# Patient Record
Sex: Female | Born: 1960
Health system: Southern US, Community
[De-identification: ages and names within clinical notes are randomized; demographics above are authoritative.]

## PROBLEM LIST (undated history)

## (undated) DIAGNOSIS — E785 Hyperlipidemia, unspecified: Secondary | ICD-10-CM

## (undated) DIAGNOSIS — E119 Type 2 diabetes mellitus without complications: Secondary | ICD-10-CM

## (undated) DIAGNOSIS — F17210 Nicotine dependence, cigarettes, uncomplicated: Secondary | ICD-10-CM

## (undated) DIAGNOSIS — F32A Depression, unspecified: Secondary | ICD-10-CM

## (undated) HISTORY — DX: Hyperlipidemia, unspecified: E78.5

## (undated) HISTORY — DX: Nicotine dependence, cigarettes, uncomplicated: F17.210

## (undated) HISTORY — PX: NO PAST SURGERIES: SHX2092

## (undated) HISTORY — DX: Depression, unspecified: F32.A

---

## 1998-02-16 ENCOUNTER — Other Ambulatory Visit: Admission: RE | Admit: 1998-02-16 | Discharge: 1998-02-16 | Payer: Self-pay | Admitting: *Deleted

## 1998-03-16 ENCOUNTER — Ambulatory Visit (HOSPITAL_COMMUNITY): Admission: RE | Admit: 1998-03-16 | Discharge: 1998-03-16 | Payer: Self-pay | Admitting: *Deleted

## 1998-03-31 ENCOUNTER — Ambulatory Visit (HOSPITAL_COMMUNITY): Admission: RE | Admit: 1998-03-31 | Discharge: 1998-03-31 | Payer: Self-pay | Admitting: Obstetrics

## 1998-05-31 ENCOUNTER — Ambulatory Visit (HOSPITAL_COMMUNITY): Admission: RE | Admit: 1998-05-31 | Discharge: 1998-05-31 | Payer: Self-pay | Admitting: *Deleted

## 1998-06-24 ENCOUNTER — Inpatient Hospital Stay (HOSPITAL_COMMUNITY): Admission: AD | Admit: 1998-06-24 | Discharge: 1998-06-24 | Payer: Self-pay | Admitting: *Deleted

## 1998-08-16 ENCOUNTER — Inpatient Hospital Stay (HOSPITAL_COMMUNITY): Admission: AD | Admit: 1998-08-16 | Discharge: 1998-08-18 | Payer: Self-pay | Admitting: *Deleted

## 1998-10-27 ENCOUNTER — Inpatient Hospital Stay (HOSPITAL_COMMUNITY): Admission: AD | Admit: 1998-10-27 | Discharge: 1998-10-27 | Payer: Self-pay | Admitting: *Deleted

## 1999-09-08 ENCOUNTER — Inpatient Hospital Stay (HOSPITAL_COMMUNITY): Admission: AD | Admit: 1999-09-08 | Discharge: 1999-09-08 | Payer: Self-pay | Admitting: *Deleted

## 1999-11-24 ENCOUNTER — Ambulatory Visit (HOSPITAL_COMMUNITY): Admission: RE | Admit: 1999-11-24 | Discharge: 1999-11-24 | Payer: Self-pay | Admitting: *Deleted

## 1999-11-24 ENCOUNTER — Encounter: Payer: Self-pay | Admitting: *Deleted

## 2000-01-02 ENCOUNTER — Inpatient Hospital Stay (HOSPITAL_COMMUNITY): Admission: AD | Admit: 2000-01-02 | Discharge: 2000-01-04 | Payer: Self-pay | Admitting: *Deleted

## 2000-01-02 ENCOUNTER — Encounter (INDEPENDENT_AMBULATORY_CARE_PROVIDER_SITE_OTHER): Payer: Self-pay

## 2001-04-25 ENCOUNTER — Emergency Department (HOSPITAL_COMMUNITY): Admission: EM | Admit: 2001-04-25 | Discharge: 2001-04-26 | Payer: Self-pay

## 2002-02-24 ENCOUNTER — Emergency Department (HOSPITAL_COMMUNITY): Admission: EM | Admit: 2002-02-24 | Discharge: 2002-02-24 | Payer: Self-pay | Admitting: *Deleted

## 2002-03-17 ENCOUNTER — Ambulatory Visit (HOSPITAL_COMMUNITY): Admission: RE | Admit: 2002-03-17 | Discharge: 2002-03-17 | Payer: Self-pay | Admitting: Family Medicine

## 2003-04-13 ENCOUNTER — Emergency Department (HOSPITAL_COMMUNITY): Admission: EM | Admit: 2003-04-13 | Discharge: 2003-04-13 | Payer: Self-pay | Admitting: *Deleted

## 2003-05-28 ENCOUNTER — Ambulatory Visit (HOSPITAL_COMMUNITY): Admission: RE | Admit: 2003-05-28 | Discharge: 2003-05-28 | Payer: Self-pay | Admitting: Family Medicine

## 2004-04-05 ENCOUNTER — Encounter: Admission: RE | Admit: 2004-04-05 | Discharge: 2004-04-05 | Payer: Self-pay | Admitting: Internal Medicine

## 2004-09-05 ENCOUNTER — Emergency Department (HOSPITAL_COMMUNITY): Admission: EM | Admit: 2004-09-05 | Discharge: 2004-09-05 | Payer: Self-pay | Admitting: Family Medicine

## 2004-09-20 ENCOUNTER — Ambulatory Visit: Payer: Self-pay | Admitting: Sports Medicine

## 2004-09-20 ENCOUNTER — Encounter (INDEPENDENT_AMBULATORY_CARE_PROVIDER_SITE_OTHER): Payer: Self-pay | Admitting: *Deleted

## 2004-10-26 ENCOUNTER — Ambulatory Visit: Payer: Self-pay | Admitting: Family Medicine

## 2004-11-02 ENCOUNTER — Ambulatory Visit: Payer: Self-pay | Admitting: Family Medicine

## 2005-12-06 ENCOUNTER — Encounter (INDEPENDENT_AMBULATORY_CARE_PROVIDER_SITE_OTHER): Payer: Self-pay | Admitting: *Deleted

## 2005-12-27 ENCOUNTER — Ambulatory Visit: Payer: Self-pay | Admitting: Family Medicine

## 2006-01-07 ENCOUNTER — Encounter: Admission: RE | Admit: 2006-01-07 | Discharge: 2006-01-07 | Payer: Self-pay | Admitting: Sports Medicine

## 2006-04-21 ENCOUNTER — Emergency Department (HOSPITAL_COMMUNITY): Admission: EM | Admit: 2006-04-21 | Discharge: 2006-04-21 | Payer: Self-pay | Admitting: Emergency Medicine

## 2006-05-21 ENCOUNTER — Ambulatory Visit: Payer: Self-pay | Admitting: Family Medicine

## 2006-05-31 ENCOUNTER — Ambulatory Visit (HOSPITAL_COMMUNITY): Admission: RE | Admit: 2006-05-31 | Discharge: 2006-05-31 | Payer: Self-pay | Admitting: Sports Medicine

## 2006-05-31 ENCOUNTER — Ambulatory Visit: Payer: Self-pay | Admitting: Sports Medicine

## 2006-06-04 ENCOUNTER — Ambulatory Visit: Payer: Self-pay | Admitting: Family Medicine

## 2006-06-20 ENCOUNTER — Ambulatory Visit: Payer: Self-pay | Admitting: Family Medicine

## 2006-07-23 ENCOUNTER — Ambulatory Visit: Payer: Self-pay | Admitting: Sports Medicine

## 2006-09-05 ENCOUNTER — Ambulatory Visit: Payer: Self-pay | Admitting: Family Medicine

## 2006-10-25 ENCOUNTER — Ambulatory Visit: Payer: Self-pay | Admitting: Family Medicine

## 2006-12-23 ENCOUNTER — Encounter: Admission: RE | Admit: 2006-12-23 | Discharge: 2006-12-23 | Payer: Self-pay | Admitting: Sports Medicine

## 2007-01-02 DIAGNOSIS — F17200 Nicotine dependence, unspecified, uncomplicated: Secondary | ICD-10-CM | POA: Insufficient documentation

## 2007-01-03 ENCOUNTER — Encounter (INDEPENDENT_AMBULATORY_CARE_PROVIDER_SITE_OTHER): Payer: Self-pay | Admitting: *Deleted

## 2007-02-19 ENCOUNTER — Ambulatory Visit: Payer: Self-pay | Admitting: Family Medicine

## 2007-02-19 ENCOUNTER — Encounter (INDEPENDENT_AMBULATORY_CARE_PROVIDER_SITE_OTHER): Payer: Self-pay | Admitting: Family Medicine

## 2007-02-19 LAB — CONVERTED CEMR LAB
ALT: 13 units/L (ref 0–35)
CO2: 22 meq/L (ref 19–32)
Glucose, Bld: 84 mg/dL (ref 70–99)
HDL: 51 mg/dL (ref >39)
LDL Cholesterol: 66 mg/dL (ref 0–99)
Potassium: 3.9 meq/L (ref 3.5–5.3)
Total Bilirubin: 0.9 mg/dL (ref 0.3–1.2)
Total Protein: 6.7 g/dL (ref 6.0–8.3)
VLDL: 12 mg/dL (ref 0–40)

## 2007-02-20 ENCOUNTER — Encounter (INDEPENDENT_AMBULATORY_CARE_PROVIDER_SITE_OTHER): Payer: Self-pay | Admitting: Family Medicine

## 2007-02-25 ENCOUNTER — Encounter: Payer: Self-pay | Admitting: *Deleted

## 2007-02-25 ENCOUNTER — Telehealth (INDEPENDENT_AMBULATORY_CARE_PROVIDER_SITE_OTHER): Payer: Self-pay | Admitting: Family Medicine

## 2007-02-26 ENCOUNTER — Encounter (INDEPENDENT_AMBULATORY_CARE_PROVIDER_SITE_OTHER): Payer: Self-pay | Admitting: Family Medicine

## 2007-03-21 ENCOUNTER — Ambulatory Visit: Payer: Self-pay | Admitting: Family Medicine

## 2007-03-21 ENCOUNTER — Encounter: Payer: Self-pay | Admitting: *Deleted

## 2007-03-21 LAB — CONVERTED CEMR LAB: Whiff Test: NEGATIVE

## 2007-03-26 ENCOUNTER — Ambulatory Visit (HOSPITAL_COMMUNITY): Admission: RE | Admit: 2007-03-26 | Discharge: 2007-03-26 | Payer: Self-pay | Admitting: Family Medicine

## 2007-03-26 ENCOUNTER — Ambulatory Visit: Payer: Self-pay | Admitting: Family Medicine

## 2007-03-26 ENCOUNTER — Telehealth: Payer: Self-pay | Admitting: *Deleted

## 2007-03-27 ENCOUNTER — Telehealth (INDEPENDENT_AMBULATORY_CARE_PROVIDER_SITE_OTHER): Payer: Self-pay | Admitting: *Deleted

## 2007-04-03 ENCOUNTER — Telehealth: Payer: Self-pay | Admitting: Family Medicine

## 2007-04-07 ENCOUNTER — Ambulatory Visit: Payer: Self-pay | Admitting: Cardiology

## 2007-05-12 ENCOUNTER — Encounter: Payer: Self-pay | Admitting: Cardiology

## 2007-05-12 ENCOUNTER — Ambulatory Visit: Payer: Self-pay

## 2007-05-12 ENCOUNTER — Ambulatory Visit: Payer: Self-pay | Admitting: Cardiovascular Disease

## 2007-07-18 ENCOUNTER — Ambulatory Visit: Payer: Self-pay | Admitting: Cardiology

## 2008-01-08 ENCOUNTER — Encounter: Admission: RE | Admit: 2008-01-08 | Discharge: 2008-01-08 | Payer: Self-pay | Admitting: Family Medicine

## 2008-01-16 ENCOUNTER — Encounter: Admission: RE | Admit: 2008-01-16 | Discharge: 2008-01-16 | Payer: Self-pay | Admitting: Family Medicine

## 2008-04-07 ENCOUNTER — Ambulatory Visit: Payer: Self-pay | Admitting: Family Medicine

## 2008-04-07 ENCOUNTER — Encounter: Payer: Self-pay | Admitting: Family Medicine

## 2008-04-07 DIAGNOSIS — E663 Overweight: Secondary | ICD-10-CM

## 2008-04-07 LAB — CONVERTED CEMR LAB
Glucose, Urine, Semiquant: NEGATIVE
Ketones, urine, test strip: NEGATIVE
Nitrite: NEGATIVE
Protein, U semiquant: NEGATIVE
Whiff Test: NEGATIVE

## 2008-04-13 ENCOUNTER — Encounter: Payer: Self-pay | Admitting: Family Medicine

## 2009-02-04 ENCOUNTER — Encounter: Admission: RE | Admit: 2009-02-04 | Discharge: 2009-02-04 | Payer: Self-pay | Admitting: Family Medicine

## 2009-04-11 ENCOUNTER — Encounter: Payer: Self-pay | Admitting: Family Medicine

## 2009-04-11 ENCOUNTER — Ambulatory Visit: Payer: Self-pay | Admitting: Family Medicine

## 2009-04-11 LAB — CONVERTED CEMR LAB
GC Probe Amp, Urine: NEGATIVE
Whiff Test: NEGATIVE

## 2009-04-13 ENCOUNTER — Encounter: Payer: Self-pay | Admitting: Family Medicine

## 2010-02-15 ENCOUNTER — Ambulatory Visit: Payer: Self-pay | Admitting: Family Medicine

## 2010-02-17 ENCOUNTER — Ambulatory Visit: Payer: Self-pay | Admitting: Family Medicine

## 2010-04-17 ENCOUNTER — Encounter: Payer: Self-pay | Admitting: Family Medicine

## 2010-05-23 ENCOUNTER — Ambulatory Visit: Payer: Self-pay | Admitting: Psychology

## 2010-05-30 ENCOUNTER — Ambulatory Visit: Payer: Self-pay | Admitting: Family Medicine

## 2010-07-05 ENCOUNTER — Ambulatory Visit: Payer: Self-pay | Admitting: Family Medicine

## 2010-07-06 ENCOUNTER — Encounter: Admission: RE | Admit: 2010-07-06 | Discharge: 2010-07-06 | Payer: Self-pay | Admitting: Family Medicine

## 2010-07-12 ENCOUNTER — Ambulatory Visit: Payer: Self-pay | Admitting: Family Medicine

## 2010-07-12 ENCOUNTER — Encounter: Payer: Self-pay | Admitting: Family Medicine

## 2010-07-13 ENCOUNTER — Telehealth: Payer: Self-pay | Admitting: *Deleted

## 2010-07-13 LAB — CONVERTED CEMR LAB
HDL: 43 mg/dL (ref >39)
Total CHOL/HDL Ratio: 3

## 2010-07-27 ENCOUNTER — Encounter: Payer: Self-pay | Admitting: Family Medicine

## 2010-07-27 ENCOUNTER — Ambulatory Visit: Payer: Self-pay | Admitting: Family Medicine

## 2010-09-12 ENCOUNTER — Ambulatory Visit: Payer: Self-pay | Admitting: Family Medicine

## 2010-09-12 DIAGNOSIS — Z780 Asymptomatic menopausal state: Secondary | ICD-10-CM

## 2010-09-12 LAB — CONVERTED CEMR LAB: Beta hcg, urine, semiquantitative: NEGATIVE

## 2010-10-13 ENCOUNTER — Ambulatory Visit: Payer: Self-pay

## 2010-10-19 ENCOUNTER — Emergency Department (HOSPITAL_COMMUNITY)
Admission: EM | Admit: 2010-10-19 | Discharge: 2010-10-20 | Payer: Self-pay | Source: Home / Self Care | Admitting: Emergency Medicine

## 2010-11-03 ENCOUNTER — Ambulatory Visit: Payer: Self-pay | Admitting: Family Medicine

## 2010-11-03 DIAGNOSIS — R209 Unspecified disturbances of skin sensation: Secondary | ICD-10-CM | POA: Insufficient documentation

## 2010-12-05 NOTE — Miscellaneous (Signed)
   Clinical Lists Changes  Problems: Removed problem of SCREENING EXAMINATION FOR PULMONARY TUBERCULOSIS (ICD-V74.1) Removed problem of ROUTINE GYNECOLOGICAL EXAMINATION (ICD-V72.31) Removed problem of SCREENING FOR MALIGNANT NEOPLASM, CERVIX (ICD-V76.2)

## 2010-12-05 NOTE — Assessment & Plan Note (Signed)
Vital Signs:  Patient profile:   50 year old female Height:      64 inches Weight:      167 pounds BMI:     28.77 Temp:     98.1 degrees F oral BP sitting:   108 / 69  (left arm) Cuff size:   regular  Vitals Entered By: Tessie Fass CMA (July 27, 2010 1:33 PM) CC: right eye pain x 3 days  Vision Screening:Left eye w/o correction: 20 / 20 Right Eye w/o correction: 20 / 20 Both eyes w/o correction:  20/ 20        Vision Entered By: Tessie Fass CMA (July 27, 2010 1:49 PM)   Primary Care Provider:  [facility]  CC:  right eye pain x 3 days.  History of Present Illness: Patient with feeling of foreign body in Right eye which began on Monday.  Attempted to remove foreign body by rubbing it, washing it out with water.  Happened while in her car with windows up, does not know what foreign body was or what happened.  Sensation resolved, but since then has been having increasing pain whenever she blinks or closes her eyes.  Describes pain as sharp, burning pain when she blinks.  Continual runny eyes.  ROS:  no fevers or chills, no change in vision, no scotomas or sudden loss of vision  Current Problems (verified): 1)  Corneal Abrasion, Right  (ICD-918.1) 2)  Lump or Mass in Breast  (ICD-611.72) 3)  Health Maintenance Exam  (ICD-V70.0) 4)  Screening For Malignant Neoplasm of The Cervix  (ICD-V76.2) 5)  Tobacco Dependence  (ICD-305.1) 6)  Bartholin's Cyst, Left or Right  (ICD-616.2) 7)  Overweight  (ICD-278.02)  Current Medications (verified): 1)  Wellbutrin Sr 150 Mg Xr12h-Tab (Bupropion Hcl) .... Take One Daily in The Am For Three Days. Then Increase To Twice Daily With Second Dose At3 Pm. 2)  Diclofenac Sodium 0.1 % Soln (Diclofenac Sodium) .... Instill 1 Drop Into Affected Eye 4 Times A Day 3)  Romycin 5 Mg/gm Oint (Erythromycin) .... Apply Ointment To Eye 3 Times A Day For Next 5 Days  Allergies (verified): No Known Drug Allergies  Past  History:  Past medical, surgical, family and social histories (including risk factors) reviewed, and no changes noted (except as noted below).  Past Medical History: Reviewed history from 02/19/2007 and no changes required. G2P2, vaginal deliveries no h/o abn paps trichomonas (12/2005) stress cardiolyte (for chest pain) negative 2007  Past Surgical History: Reviewed history from 02/19/2007 and no changes required. BTL - 11/06/1999 sebaceous cyst removed from right occipital region - 10/26/2004  Family History: Reviewed history from 02/19/2007 and no changes required. children - healthy father - DMII, HTN, alcoholic, deceased at 45y/o mother - DMII, HTN, kidney stones, living at 50y/o  Social History: Reviewed history from 04/11/2009 and no changes required. 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 ; No insurance; daughter Linford Arnold) and son Belva Crome). Pt smoked about 30 yrs; stopped smoking 7/312007.  Physical Exam  General:  Vital signs reviewed. Well-developed, well-nourished patient in NAD.  Awake and cooperative  Eyes:  Vision 20/20 both eyes Left eye WNL, no findings on exam. RIght eye with constant tearing, conjunctival erythema.  Woods lamp exam performed, demonstrated corneal abrasion lateral aspect of cornea after application of Fluroscein   Impression & Recommendations:  Problem # 1:  CORNEAL ABRASION, RIGHT (ICD-918.1) Applied Tetracaine in clinic for relief.  Prescribed Diclofenac drops and  Erythromycin ointment to be applied for antibiotic prophylaxis.  Patient is to return or call our clinic on Monday, if no relief over the weekend will urgently refer to ophthamologist.  Otherwise abrasion should heal well.   Orders: [facility]- Est Level  3 ([address]109)  Complete Medication List: 1)  Wellbutrin Sr 150 Mg Xr12h-tab (Bupropion hcl) .... Take one daily in the am for three days. then increase to twice daily with second dose at3 pm. 2)  Diclofenac  Sodium 0.1 % Soln (Diclofenac sodium) .... Instill 1 drop into affected eye 4 times a day 3)  Romycin 5 Mg/gm Oint (Erythromycin) .... Apply ointment to eye 3 times a day for next 5 days  Patient Instructions: 1)  You have a corneal abrasion, or a scratch on your eyeball.  2)  It should heal on its own. 3)  Apply the pain reliever (Diclofenac) 1 drop a day 4 times a day for relief. 4)  Apply the ointment 3 times a day. 5)  If you are not doing better by Monday, call and we will schedule you for an eye doctor.  Prescriptions: ROMYCIN 5 MG/GM OINT (ERYTHROMYCIN) Apply ointment to eye 3 times a day for next 5 days  #1 x 0   Entered and Authorized by:   Renold Don MD   Signed by:   Renold Don MD on 07/27/2010   Method used:   Print then Give to Patient   RxID:   [id number] DICLOFENAC SODIUM 0.1 % SOLN (DICLOFENAC SODIUM) Instill 1 drop into affected eye 4 times a day  #1 x 0   Entered and Authorized by:   Renold Don MD   Signed by:   Renold Don MD on 07/27/2010   Method used:   Print then Give to Patient   RxID:   [id number]

## 2010-12-05 NOTE — Assessment & Plan Note (Signed)
Summary: cpp, breast lump   Vital Signs:  Patient profile:   50 year old female Height:      64 inches Weight:      163.8 pounds BMI:     28.22 Temp:     98.8 degrees F Pulse rate:   76 / minute BP sitting:   110 / 71  Vitals Entered By: Golden Circle RN (July 05, 2010 8:28 AM)  Primary Care Khalis Hittle:  [facility]  CC:  Physical and breast exam.  History of Present Illness: 50 y/o F who comes in today for a physical. She has not had any recent concerns. She was suppose to do her mammogram months ago but has not yet done it. She will be due to start having a colonoscopy next year.   Tobacco: Pt still smokes about 2 cigs/day. She was able to quit last time for 3 years using the Welbutrin. She is taking it again and has been coming to the classes with Dr. Pascal Lux and Raymondo Band.   Habits & Providers  Alcohol-Tobacco-Diet     Alcohol drinks/day: 0     Tobacco Status: current     Tobacco Counseling: to quit use of tobacco products     Cigarette Packs/Day: <0.25  Exercise-Depression-Behavior     Does Patient Exercise: no     Have you felt down or hopeless? no     Have you felt little pleasure in things? no     Depression Counseling: not indicated; screening negative for depression     STD Risk: never     Drug Use: never     Seat Belt Use: always  Current Medications (verified): 1)  Wellbutrin Sr 150 Mg Xr12h-Tab (Bupropion Hcl) .... Take One Daily in The Am For Three Days. Then Increase To Twice Daily With Second Dose At3 Pm.  Allergies (verified): No Known Drug Allergies  Social History: Packs/Day:  <0.25 STD Risk:  never  Review of Systems       ROS neg  Physical Exam  General:  Well-developed,well-nourished,in no acute distress; alert,appropriate and cooperative throughout examination, VS reviewed, Overweight.  Head:  Normocephalic and atraumatic without obvious abnormalities. No apparent alopecia or balding. Eyes:  No corneal or conjunctival inflammation  noted. EOMI. Perrla. Funduscopic exam benign, without hemorrhages, exudates or papilledema. Vision grossly normal. Nose:  External nasal examination shows no deformity or inflammation. Nasal mucosa are pink and moist without lesions or exudates. Mouth:  Oral mucosa and oropharynx without lesions or exudates.  Teeth in good repair. Neck:  No deformities, masses, or tenderness noted. Breasts:  Rt breast had 1.5 cm lump just to the right of the nipple, Left breast normal.  Lungs:  Normal respiratory effort, chest expands symmetrically. Lungs are clear to auscultation, no crackles or wheezes. Heart:  Normal rate and regular rhythm. S1 and S2 normal without gallop, murmur, click, rub or other extra sounds. Abdomen:  Bowel sounds positive,abdomen soft and non-tender without masses, organomegaly or hernias noted. Genitalia:  Normal introitus for age, no external lesions, no vaginal discharge, mucosa pink and moist, no vaginal or cervical lesions, no vaginal atrophy, no friaility or hemorrhage, normal uterus size and position, no adnexal masses or tenderness Msk:  No deformity or scoliosis noted of thoracic or lumbar spine.   Skin:  Intact without suspicious lesions or rashes Axillary Nodes:  No palpable lymphadenopathy Psych:  Cognition and judgment appear intact. Alert and cooperative with normal attention span and concentration. No apparent delusions, illusions, hallucinations  Impression & Recommendations:  Problem # 1:  HEALTH MAINTENANCE EXAM (ICD-V70.0) Assessment Unchanged Other than the breast lump pt has normal exam. She is working on smoking cessation. She is due for a colonosocpy starting next eyar.   Orders: [facility] - Est  40-64 yrs (16109)  Problem # 2:  LUMP OR MASS IN BREAST (ICD-611.72) Assessment: New Pt had a new breast lump on PE. Sent for Diagnostic mammogram   Orders: Mammogram (Diagnostic) (Mammo)  Problem # 3:  OVERWEIGHT (ICD-278.02) Assessment: Unchanged Pt is  overweight. Plan to get a FLP in the future. SHe is not fasting today.   Future Orders: Lipid-[facility] ([id number]) ... 06/20/2011  Complete Medication List: 1)  Wellbutrin Sr 150 Mg Xr12h-tab (Bupropion hcl) .... Take one daily in the am for three days. then increase to twice daily with second dose at3 pm.  Other Orders: Pap Smear-[facility] ([id number])  Patient Instructions: 1)  Keep up the good work with the smoking cessation. You are doing very well!  2)  I have ordered a cholesterol panal for you.  3)  Make a LAB appointment with the front desk and come in some morning after fasting for 8-12 hours the night before.  4)  Get your mammogram done as soon as possible.

## 2010-12-05 NOTE — Progress Notes (Signed)
Summary: msg   Phone Note Call from Patient Call back at [facility] Phone [phone]   Caller: Patient Summary of Call: pt returned call Initial call taken by: De Nurse,  July 13, 2010 3:55 PM  Follow-up for Phone Call        informed pt of nl labs Follow-up by: Loralee Pacas CMA,  July 14, 2010 11:59 AM

## 2010-12-05 NOTE — Assessment & Plan Note (Signed)
Summary: tb test,tcb  Nurse Visit   Allergies: No Known Drug Allergies  Immunizations Administered:  PPD Skin Test:    Vaccine Type: PPD    Site: right forearm    Mfr: Sanofi Pasteur    Dose: 0.1 ml    Route: ID    Given by: Patricia Smith RN    Exp. Date: 08/18/2011    Lot #: [id number]  Orders Added: 1)  TB Skin Test [86580] 2)  Admin 1st Vaccine [90471] 

## 2010-12-05 NOTE — Assessment & Plan Note (Signed)
Summary: Smoking Cessation Class   Primary Care Provider:  Lequita Asal  MD   History of Present Illness: Patient attended a smoking cessation class, reporting that a similar class helped her to quit smoking a few years ago.  Currently, patient reported smoking about a half of a pack of cigarettes per day.    Allergies: No Known Drug Allergies   Impression & Recommendations:  Problem # 1:  TOBACCO DEPENDENCE (ICD-305.1)  Patient appeared to be in the preparation stage of change regarding smoking cessation.  On a scale of 1-10, she reported her desire to quit as an 8.  She set a quit date (06/04/10) at the end of the class.  Her primary motivation to quit smoking was health concerns.  She notices that she has a more difficult time breathing when she smokes.  Patient reported that she stopped smoking for a period of 3 years and that the smoking cessation course, plus the use of Wellbutrin, helped her to quit.  She expressed interest in talking to the pharmacist or her PCP about again using Wellbutrin as an aid.  Patient's daughter serves as a support for her quitting smoking.  Patient said that she likely will not be able to attend the next smoking cessation class (meet with pharmacist); however, I suggested that she try to schedule an appointment with the pharmacist or her PCP on a different day to talk about the use of medications as smoking cessation aids.     Armida Sans  Behavioral Medicine Student  May 24, 2010 9:42 AM  Orders: Smoking Cessation Class [phone])

## 2010-12-05 NOTE — Assessment & Plan Note (Signed)
Summary: pregnancy test/bmc   Vital Signs:  Patient profile:   50 year old female LMP:     06/13/2010 Weight:      167 pounds Temp:     98.8 degrees F oral Pulse rate:   69 / minute Pulse rhythm:   regular BP sitting:   117 / 72  (left arm) Cuff size:   regular  Vitals Entered By: Loralee Pacas CMA (September 12, 2010 2:37 PM)  Allergies: No Known Drug Allergies   Complete Medication List: 1)  Wellbutrin Sr 150 Mg Xr12h-tab (Bupropion hcl) .... Take one daily in the am for three days. then increase to twice daily with second dose at3 pm. 2)  Diclofenac Sodium 0.1 % Soln (Diclofenac sodium) .... Instill 1 drop into affected eye 4 times a day 3)  Romycin 5 Mg/gm Oint (Erythromycin) .... Apply ointment to eye 3 times a day for next 5 days  Other Orders: U Preg-[facility] ([id number])   Orders Added: 1)  U Preg-[facility] [[id number]]    Laboratory Results   Urine Tests  Date/Time Received: September 12, 2010 2:50 PM  Date/Time Reported: September 12, 2010 3:03 PM     Urine HCG: negative Comments: ...............test performed by......Marland KitchenBonnie A. [place], MLS (ASCP)cm

## 2010-12-05 NOTE — Assessment & Plan Note (Signed)
Summary: read tb/eo   Nurse Visit   Allergies: No Known Drug Allergies  PPD Results    Date of reading: 02/17/2010    Results: < 5mm    Interpretation: negative  Orders Added: 1)  Est Level 1- [facility] [[address]109]

## 2010-12-05 NOTE — Assessment & Plan Note (Signed)
Summary: F/U VISIT/[facility]   Habits & Providers  Alcohol-Tobacco-Diet     Tobacco Status: current     Cigarette Packs/Day: .5     Year Started: 1980     Pack years: 15.50  Current Medications (verified): 1)  Wellbutrin Sr 150 Mg Xr12h-Tab (Bupropion Hcl) .... Take One Daily in The Am For Three Days. Then Increase To Twice Daily With Second Dose At3 Pm.  Allergies (verified): No Known Drug Allergies  Social History: Smoking Status:  current Packs/Day:  .5   Impression & Recommendations:  Problem # 1:  TOBACCO DEPENDENCE (ICD-305.1)  Patient attended group tobacco cessation class.  Qit date selected: 06/04/2010 Mild - Moderate Nicotine Abuse in a patient who is: good candidate for success b/c [id number] long term quit with use of wellbutrin.  Initiated bupropion. Patient denies seizure history.  Counseled on purpose, proper use, and potential adverse effects, including insomnia.   Written information provided:  F/U Rx Clinic Visit: in September.   Next visit to meet new physician in August.    Total time with patient in face-to-face counseling: 15   minutes.  Patient seen with:  Ivy de Thana Ramp [employer], DO  .   Orders: Smoking Cessation Class (U7253)  Complete Medication List: 1)  Wellbutrin Sr 150 Mg Xr12h-tab (Bupropion hcl) .... Take one daily in the am for three days. then increase to twice daily with second dose at3 pm.  Patient Instructions: 1)  Start Wellbutrin 150mg  once daily in the AM for the first three days then increase to twice daily ( 2-3 PM).  2)  Schedule visit with [place] team doctor in the next month.  3)  Next visit with pharmacist in Rx Clinic in September.  Prescriptions: WELLBUTRIN SR 150 MG XR12H-TAB (BUPROPION HCL) Take one daily in the AM for three days. Then increase to twice daily with second dose at3 PM.  #60 x 1   Entered by:   Christian Mate D   Authorized by:   Marland Kitchen FPCDEFAULTPROVIDER   Signed by:   Madelon Lips Pharm D on 05/30/2010   Method used:    Faxed to ...       [facility] (retail)       [address] [place], [place]  [address]       Ph: [phone]       Fax: [phone]   RxID:   [id number]   Tobacco Counseling   Currently uses tobacco.    Cigarettes      Year started smoking cigarettes:      1980     Number of packs per day smoked:      .5     Years smoked:            27     Packs per year:          182.50     Pack Years:            15.50  Readiness to Quit:     Very Ready Cessation Stage:     Action Target Quit Date:     06/04/2010  Quitting Barriers:      -  stress     -  enjoys smoking  Quitting Motivators:      -  family pressure     -  social pressure     -  healthier lifestyle  Previous Quit Attempts   Previously Tried to quit:  Yes Longest Successful Quit Period:   3 years  Quit Methods Tried:      -  Zyban  Reason for restarting:      -  stress  Smoking Cessation Plan   Readiness:     Very Ready Cessation Stage:   Action Target Quit Date:   06/04/2010 New Medications: WELLBUTRIN SR 150 MG XR12H-TAB (BUPROPION HCL) Take one daily in the AM for three days. Then increase to twice daily with second dose at3 PM.  Medications Added this Update:  WELLBUTRIN SR 150 MG XR12H-TAB (BUPROPION HCL) Take one daily in the AM for three days. Then increase to twice daily with second dose at3 PM.  Orders Added this Update:  Smoking Cessation Class [N8295]

## 2010-12-07 NOTE — Assessment & Plan Note (Signed)
Summary: lf arm tingling/numbness off and on/[facility]   Vital Signs:  Patient profile:   50 year old female Weight:      167 pounds Temp:     98.3 degrees F oral Pulse rate:   70 / minute Pulse rhythm:   regular BP sitting:   160 / 65  (left arm) Cuff size:   regular  Vitals Entered By: Loralee Pacas CMA (November 03, 2010 1:50 PM) CC: tingling and numbness in left arm x 1 1/2 months Is Patient Diabetic? No Pain Assessment Patient in pain? no        Primary Care Provider:  [facility]  CC:  tingling and numbness in left arm x 1 1/2 months.  History of Present Illness: Left arm numbness: Pt has had intermittant left arm numbness for the last 1.5 monhts. She has numbness that starts at the shoulder (not the neck) and goes down into her entire hand. She does not have any pain or weakness. It happens more at work or when she has her hand laying in her lap but not always. She says she has not had any recent injuries to neck or back or shoulder but does sleep on that side sometimes. The numbness has not kept her from doing anything she loves to do.  Recently sent to ED, was evaluated, no causation found.   Current Medications (verified): 1)  Naprosyn 250 Mg Tabs (Naproxen) .... Take 1 Tablet Twice A Day For Pain.  Allergies (verified): No Known Drug Allergies  Review of Systems       no left sided facial numbness, no neck pain, no blurry vision, no limb pain  Physical Exam  General:  Well-developed,well-nourished,in no acute distress; alert,appropriate and cooperative throughout examination Msk:  Inspection equal arms bilaterally Palpation: no abnormalities noted on palpation bilaterall ROM: Pt has full ROm bilaterally Strength:equal strength with internal and external rotation of shoulder, no deficits on biceps and triceps testing, handgrip is normal bilaterally. No deficits noted.  No pain on neck palpation or pressure rotation.  All reflexes symmetrical  Extremities:   no cyanosis or edema to uper extremities    Impression & Recommendations:  Problem # 1:  DISTURBANCE OF SKIN SENSATION (ICD-782.0) Assessment New pt is having some numbness in her left arm from the shoulder down for the last 1.5 months. She has been evaluated in the ED, and I evaluated her here as well. There is no apparent cause for her intermittant numbness at this time. Cautioned that if it becomes consistant or painful she should come back to be reevaluated. Discussed with Dr. Jennette Kettle in office   Orders: [facility]- Est Level  3 ([address]454)  Complete Medication List: 1)  Naprosyn 250 Mg Tabs (Naproxen) .... Take 1 tablet twice a day for pain.  Patient Instructions: 1)  I was very reassured after talking to one of the sports medicine doctors. The fact that you don't have pain and/or constant numbness is reassuring.  2)  If you develop constant pain, constant numbness or weakness then come back to be evaluated.  3)  Try not to lay on your left side because this may be irritating a nerve in your arm.  Prescriptions: NAPROSYN 250 MG TABS (NAPROXEN) take 1 tablet twice a day for pain.  #60 x 3   Entered and Authorized by:   Jamie Brookes MD   Signed by:   Jamie Brookes MD on 11/03/2010   Method used:   Electronically to  [facility] [address] [phone]* (retail)       [address]       [place], [place]  [address]       Ph: [phone]       Fax: [phone]   RxID:   [id number]    Orders Added: 1)  [facility]- Est Level  3 [24401]

## 2010-12-25 ENCOUNTER — Encounter: Payer: Self-pay | Admitting: *Deleted

## 2011-01-15 LAB — CBC
HCT: 32.6 % — ABNORMAL LOW (ref 36.0–46.0)
Hemoglobin: 11.6 g/dL — ABNORMAL LOW (ref 12.0–15.0)
MCH: 29.4 pg (ref 26.0–34.0)
MCV: 82.7 fL (ref 78.0–100.0)
Platelets: 184 10*3/uL (ref 150–400)
RDW: 12.7 % (ref 11.5–15.5)

## 2011-01-15 LAB — BASIC METABOLIC PANEL
CO2: 25 mEq/L (ref 19–32)
Chloride: 108 mEq/L (ref 96–112)
GFR calc Af Amer: >60 mL/min (ref >60)
GFR calc non Af Amer: >60 mL/min (ref >60)
Glucose, Bld: 103 mg/dL — ABNORMAL HIGH (ref 70–99)
Potassium: 3.4 mEq/L — ABNORMAL LOW (ref 3.5–5.1)

## 2011-01-15 LAB — DIFFERENTIAL
Basophils Relative: 0 % (ref 0–1)
Eosinophils Absolute: 0.1 10*3/uL (ref 0.0–0.7)
Eosinophils Relative: 1 % (ref 0–5)
Monocytes Absolute: 0.6 10*3/uL (ref 0.1–1.0)
Monocytes Relative: 7 % (ref 3–12)
Neutro Abs: 5.5 10*3/uL (ref 1.7–7.7)

## 2011-03-20 NOTE — Letter (Signed)
July 18, 2007    Paticia Stack, MD  [address]  [place], [place]  [address]   RE:  ABAGALE, BOULOS  MRN:  [id number]  /  DOB:  1960/12/12   Dear Dr. Yetta Barre:   Ms. Hargrove stopped by today for a followup office visit.  She was in  a hurry and had to leave, but I did get an opportunity to have a brief  visit with her.  As you know, she had previously presented with some  chest discomfort.  The exact etiology of this was unclear.  Fortunately,  she has quit smoking.  In July the patient underwent an exercise  tolerance test.  She exercised for 9 minutes, her heart rate reached  164, 94% of age predicted maximum.  The test was felt to be a negative  test for ischemia.  The patient subsequently underwent echocardiography  which demonstrated normal left ventricular systolic function and no  regional wall motion abnormalities, and there was trivial aortic  regurgitation.  She also had carotid Dopplers which were entirely within  normal limits.  She also had had a prior chest x-ray in June of 2007  which revealed mild bronchitic changes.   I made it clear to her that I think it is important that she exercise  regularly and continue not to smoke.  I have encouraged her to follow up  with you in the office.  If she should have further problems, please do  not hesitate to let me know.  I think she is significantly improved and  she indicated as much when I saw her.  Thanks for referring her.    Sincerely,      Arturo Morton. Riley Kill, MD, [facility]  Electronically Signed    TDS/MedQ  DD: 07/23/2007  DT: 07/23/2007  Job #: [id number]

## 2011-03-20 NOTE — Assessment & Plan Note (Signed)
[facility]                            CARDIOLOGY OFFICE NOTE   NAME:Danielle Mccann               MRN:          [id number]  DATE:04/07/2007                            DOB:          [birth year]    REFERRING PHYSICIAN:  Paticia Stack, MD   CHIEF COMPLAINT:  My heart rate is slow.   HISTORY OF PRESENT ILLNESS:  [name] is a pleasant 50 year old  female who works at [employer].  Recently, while walking at [facility], the patient developed some mild shortness of breath and  had to sit down.  She developed a second episode similar to this when  she was at [employer].  There was no definite radiation.  She could not  feel any real tightening of the chest.  Fortunately, this women has  stopped smoking, about 9 months ago.  She says it was hard to describe,  but that it was just somewhat of an annoying feeling in the center of  the chest.  In further questioning, there was no clear radiation or  other associated symptoms such as nausea or vomiting.  Importantly, the  patient says that she did have a stress test done about a year ago over  at [facility].  She quit smoking about 9 months ago and on  careful questioning, she says that this was done primarily because of  some chest tightness at that time as well.  Also of note, the patient  does note that she has had a history of a murmur in the past.  She  admitted to this after we had discovered a murmur on examination.   PAST MEDICAL HISTORY:  The patient has had no hospitalizations.  She has  had no outpatient surgeries.  She has not had hypertension or diabetes.   MEDICATIONS:  She takes no medications.   ALLERGIES:  There are no known drug allergies.   SOCIAL HISTORY:  The patient has a boyfriend.  She has 2 children.  She  was a smoker, but has not smoked in nearly 9 months.  She does work at  [employer] at [employer].   REVIEW OF SYSTEMS:  The patient does  admit to a murmur in the past.  She  had chest tightness about a year ago.  She has been told that she needs  to take iron in the past and she has continued to have menstrual  periods; importantly, she has had 3 in the last month.  She has had  perhaps some trace edema in the feet when she works.   The review of systems is otherwise negative.   FAMILY HISTORY:  The patient's father died at 98; she said alcohol was a  major contributor; he had diabetes.  Her mother is currently 86 and has  had a CVA.  She has 5 siblings, 1 of whom lives in [place] and the  remainder live in the [place] area.  She says she does not know of any  specific illnesses related to this.   PHYSICAL EXAMINATION:  She is an alert, oriented female in no acute  distress.  The height is 5 feet 4 inches.  The weight is 170 pounds.  The blood  pressure is 112/58 on the right, 108/56 on the left.  The pulse is 75.  The carotid upstrokes are brisk.  There is a moderate left carotid  bruit.  There are also bruits noted in the clavicular area, although  these are not all that loud.  The neck is supple.  The thyroid is not  particularly enlarged.  The lung fields are clear to auscultation and percussion.  The PMI is nondisplaced.  There is a soft systolic ejection murmur of  1/6 to 2/6 noted at the left sternal edge.  Respirations and Valsalva do  not appear to change this.  ABDOMEN:  Soft without obvious hepatosplenomegaly.  EXTREMITIES:  No edema.  Distal pulses are intact.  NEUROLOGIC:  Exam is nonfocal.   ELECTROCARDIOGRAM:  Demonstrates normal sinus rhythm and diffuse  nonspecific T-wave flattening.   The patient had a chest x-ray done a year or so ago and she was told  that she had some inflammation and maybe some early emphysema.  She  knows no other information, but apparently this was at the [facility].   The patient did have some lab work done about a month ago for a physical   examination.   IMPRESSION:  1. A brief episode of mild chest discomfort and shortness of breath      associated with activity recently.  2. Borderline abnormal EKG with nonspecific T-wave abnormality.  3. Left carotid bruit.  4. Systolic ejection murmur, nonspecific.   RECOMMENDATIONS:  1. We will recommend that the patient have a two-dimensional      echocardiogram.  2. Carotid Doppler study should be done.  3. We will try to find information regarding recent blood work and/or      chest x-ray.  4. An exercise tolerance test will be ordered to determine both      exercise prescription and to exclude ischemia.  5. I have encouraged her on the discontinuation of her smoking.   We will have the patient return in the next week or so to try to get  these studies accomplished.  We will also try to obtain her old records.   CONSULT LEVEL:  III    Arturo Morton. Riley Kill, MD, [facility]  Electronically Signed   TDS/MedQ  DD: 04/07/2007  DT: 04/08/2007  Job #: [id number]   cc:   Morley Kos, M.D.

## 2011-03-20 NOTE — Procedures (Signed)
[facility]                              EXERCISE TREADMILL   NAME:[name], [name]               MRN:          [id number]  DATE:05/12/2007                            DOB:          August 22, 1961    INDICATION:  Chest pain.   Danielle Mccann, a 50 year old African-American female, had a  resting heart rate of 80 b.p.m. and a resting blood pressure of 117/66.  She exercised for 9 minutes according to the Bruce protocol, achieving a  maximum work level of 10.4 metabolic equivalents.  Her maximal heart  rate was 164 b.p.m., which represents 94% of her age predicted maximum  heart rate.  Her blood pressure rose to 116/68.  The test was stopped  due to fatigue.   FINDINGS:  Baseline EKG is within normal limits with the exception of a  nonspecific T-wave abnormality.  At low level exercise there was  ventricular bigeminy.  After 1 minute of exercise this resolved.  There  was no chest pain, arrhythmia or significant ST change with exercise.   CONCLUSION:  Negative exercise treadmill stress test for ischemia.     Veverly Fells. Excell Seltzer, MD  Electronically Signed    MDC/MedQ  DD: 05/12/2007  DT: 05/12/2007  Job #: [id number]   cc:   Arturo Morton. Riley Kill, MD, [facility]  Paticia Stack, MD  Morley Kos, M.D.

## 2011-03-23 NOTE — Op Note (Signed)
[facility]  Patient:    Danielle Mccann, Danielle Mccann               MRN: [id number] Proc. Date: 01/03/00 Adm. Date:  [id number] Attending:  Deniece Ree                           Operative Report  PREOPERATIVE DIAGNOSIS:       Multiparity, desirous of permanent sterilization.  POSTOPERATIVE DIAGNOSIS:  OPERATION:                    Bilateral tubal ligation using modified Pomeroy procedure.  SURGEON:                      Deniece Ree, M.D.  ASSISTANT:  ANESTHESIA:                   General anesthesia.  ESTIMATED BLOOD LOSS:         Less than 25 cc.  COMPLICATIONS:                None.  CONDITION:                    The patient tolerated the procedure well and returned to the recovery room in satisfactory condition.  DESCRIPTION OF PROCEDURE:     The patient was taken to the operating room and prepped and draped in the usual fashion for a postpartum sterilization procedure. A subumbilical incision was made.  This was carried down to the fascia and then the peritoneum.  After the incision was extended and with some manipulation, the right tube was identified, grasped with a Babcock clamp, and followed out until the fimbriated end was identified.  At this point, it was knuckled up and utilizing 0 plain catgut ligated without any difficulty.  This was done likewise on the opposite side.  Both tubal segments were then labled and sent to pathology. Both tubal stump areas were then cauterized with the use of a cautery.  At this point, sponge, needle, and instrument counts were correct x 2 and hemostasis was present. The abdominal peritoneum and fascia were closed using 0 chromic in a running locking stitch followed by a subcuticular stitch using 4-0 Vicryl.  The procedure was terminated.  The patient tolerated the procedure well and returned to the recovery room in satisfactory condition. DD:  01/03/00 TD:  01/03/00 Job:  [id number]

## 2011-03-23 NOTE — H&P (Signed)
[facility]  Patient:    Danielle Mccann, Danielle Mccann               MRN: [id number] Adm. Date:  [id number] Attending:  Deniece Ree                         History and Physical  HISTORY OF PRESENT ILLNESS:   The patient is a 50 year old, gravida 2, para 2, status post vaginal delivery of a female infant.  The patient is desirous of permanent sterilization which she has stated throughout this pregnancy.  All of her questions were answered to her satisfaction.  She understands that this procedure is intended to be permanent, however, cannot be guaranteed.  She is scheduled at this time for a bilateral tubal ligation.  PHYSICAL EXAMINATION:  GENERAL:                      Revealed a well-developed, well-nourished, postpartum female in no acute distress.  HEENT:                        Within normal limits.  NECK:                         Supple.  BREASTS:                      Without masses, tenderness, or discharge.  LUNGS:                        Clear to auscultation and percussion.  HEART:                        Normal sinus rhythm without murmurs, rubs, or gallops.  ABDOMEN:                      Benign with the uterine fundus extending to the umbilicus.  EXTREMITIES: NEUROLOGICAL:    Within normal limits.  PELVIC:                       Not done.  ADMISSION DIAGNOSES:          1. Multiparity.                               2. Desirous of permanent sterilization.  PLAN:                         Bilateral tubal ligation using the modified Pomeroy procedure. DD:  01/03/00 TD:  01/03/00 Job: [id number]

## 2011-07-12 ENCOUNTER — Other Ambulatory Visit: Payer: Self-pay | Admitting: Family Medicine

## 2011-07-12 DIAGNOSIS — Z1231 Encounter for screening mammogram for malignant neoplasm of breast: Secondary | ICD-10-CM

## 2011-08-14 ENCOUNTER — Ambulatory Visit: Payer: Self-pay

## 2011-09-04 ENCOUNTER — Encounter: Payer: Self-pay | Admitting: Family Medicine

## 2011-10-24 ENCOUNTER — Ambulatory Visit: Payer: Self-pay

## 2011-11-14 ENCOUNTER — Emergency Department (HOSPITAL_COMMUNITY)
Admission: EM | Admit: 2011-11-14 | Discharge: 2011-11-14 | Disposition: A | Discharge status: Home / Self Care | Payer: Self-pay | Source: Home / Self Care | Attending: Emergency Medicine | Admitting: Emergency Medicine

## 2011-11-14 DIAGNOSIS — Z5321 Procedure and treatment not carried out due to patient leaving prior to being seen by health care provider: Secondary | ICD-10-CM

## 2011-11-14 NOTE — ED Provider Notes (Signed)
History     CSN: [id number]  Arrival date & time 11/14/11  1741   First MD Initiated Contact with Patient 11/14/11 1836      No chief complaint on file.   (Consider location/radiation/quality/duration/timing/severity/associated sxs/prior treatment) HPI  No past medical history on file.  No past surgical history on file.  No family history on file.  History  Substance Use Topics  . Smoking status: Not on file  . Smokeless tobacco: Not on file  . Alcohol Use: Not on file    OB History    No data available      Review of Systems  Allergies  Review of patient's allergies indicates not on file.  Home Medications   Current Outpatient Rx  Name Route Sig Dispense Refill  . NAPROXEN 250 MG PO TABS Oral Take 250 mg by mouth 2 (two) times daily.        There were no vitals taken for this visit.  Physical Exam  ED Course  Procedures (including critical care time)  Labs Reviewed - No data to display No results found.   No diagnosis found.    MDM  Pt left prior to triage. I did not participate in the care of this pt.  Luiz Blare, MD 11/14/11 2141

## 2011-11-16 ENCOUNTER — Encounter: Payer: Self-pay | Admitting: Family Medicine

## 2011-11-16 ENCOUNTER — Ambulatory Visit (INDEPENDENT_AMBULATORY_CARE_PROVIDER_SITE_OTHER): Payer: Self-pay | Admitting: Family Medicine

## 2011-11-16 VITALS — BP 106/69 | HR 66 | Temp 98.0°F | Ht 64.0 in | Wt 168.0 lb

## 2011-11-16 DIAGNOSIS — R21 Rash and other nonspecific skin eruption: Secondary | ICD-10-CM

## 2011-11-16 DIAGNOSIS — L989 Disorder of the skin and subcutaneous tissue, unspecified: Secondary | ICD-10-CM | POA: Insufficient documentation

## 2011-11-16 HISTORY — DX: Disorder of the skin and subcutaneous tissue, unspecified: L98.9

## 2011-11-16 MED ORDER — TRIAMCINOLONE ACETONIDE 0.1 % EX CREA: TOPICAL_CREAM | Freq: Two times a day (BID) | CUTANEOUS | Status: DC

## 2011-11-16 NOTE — Patient Instructions (Signed)
Could be due to new soaps, detergents, or bug bites you encountered while traveling.  Use triamcinolone cream to help with itching- don't scratch  If you start to get new spots or does not go away, come back for re-evaluation

## 2011-11-16 NOTE — Assessment & Plan Note (Signed)
Rash likely exposure to new irritants vs bug bites after traveling.  No new lesions and all seem to be resolving- no evidence of burrows or continual exposures..  Will treat symptomatically with topical steroid and advised to return if does not continue to resolve or has new lesions

## 2011-11-16 NOTE — Progress Notes (Signed)
  Subjective:    Patient ID: Danielle Mccann, female    DOB: 15-May-1961, 51 y.o.   MRN: [id number]  HPI Work in appt for rash  Started 5 days ago after she had a visit at her sister's house in [place].  Does not know if sister was affected but her children have no symptoms.  Many new exposure to soaps and detergents while at her house.  No fever,chills.  Bumps notes on her arms and legs and back.  Very pruritic.  No new lesions- all occurred at once.   Review of Systemssee HPI     Objective:   Physical Exam GEN: NAD Skin:  Appears as if started as small papules which she has scratched the top off.  Several lesions notes on both surfaces of forearms, a few on lower legs, some on back.  Non in web spaces, neck, umbilicus.       Assessment & Plan:

## 2012-03-19 ENCOUNTER — Emergency Department (HOSPITAL_COMMUNITY)
Admission: EM | Admit: 2012-03-19 | Discharge: 2012-03-19 | Disposition: A | Discharge status: Home / Self Care | Payer: No Typology Code available for payment source | Attending: Emergency Medicine | Admitting: Emergency Medicine

## 2012-03-19 ENCOUNTER — Encounter (HOSPITAL_COMMUNITY): Payer: Self-pay | Admitting: Emergency Medicine

## 2012-03-19 ENCOUNTER — Emergency Department (HOSPITAL_COMMUNITY): Payer: No Typology Code available for payment source

## 2012-03-19 DIAGNOSIS — R079 Chest pain, unspecified: Secondary | ICD-10-CM | POA: Insufficient documentation

## 2012-03-19 DIAGNOSIS — Y92410 Unspecified street and highway as the place of occurrence of the external cause: Secondary | ICD-10-CM | POA: Insufficient documentation
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
## 2012-03-24 ENCOUNTER — Ambulatory Visit (INDEPENDENT_AMBULATORY_CARE_PROVIDER_SITE_OTHER): Payer: No Typology Code available for payment source | Admitting: Family Medicine

## 2012-03-24 ENCOUNTER — Encounter: Payer: Self-pay | Admitting: Family Medicine

## 2012-03-24 ENCOUNTER — Telehealth: Payer: Self-pay | Admitting: Family Medicine

## 2012-03-24 VITALS — BP 113/73 | HR 61 | Temp 98.1°F | Ht 64.0 in | Wt 166.5 lb

## 2012-03-24 DIAGNOSIS — Z01419 Encounter for gynecological examination (general) (routine) without abnormal findings: Secondary | ICD-10-CM

## 2012-03-24 DIAGNOSIS — Z131 Encounter for screening for diabetes mellitus: Secondary | ICD-10-CM

## 2012-03-24 DIAGNOSIS — Z13220 Encounter for screening for lipoid disorders: Secondary | ICD-10-CM

## 2012-03-24 NOTE — Progress Notes (Signed)
  Subjective:    Patient ID: Candis Musa, female    DOB: 12/10/1960, 51 y.o.   MRN: [id number]  HPI Annual Gynecological Exam  G2P2 Wt Readings from Last 3 Encounters:  03/24/12 166 lb 8 oz (75.524 kg)  11/16/11 168 lb (76.204 kg)  11/03/10 167 lb (75.751 kg)   Last period:  over a year ago, menopausal Regular periods: No  Heavy bleeding: no  Sexually active: no Birth control or hormonal therapy:no Hx of STD: Patient does notdesires STD screening Dyspareunia: No Hot flashes: yes, manageable. Vaginal discharge: no Dysuria:No  Last mammogram: over 1 year ago, normal Breast mass or concerns: No Last Pap: 06/2010 History of abnormal pap: No  FH of breast, uterine, ovarian, colon cancer: No  I have reviewed patient's  PMH, FH, and Social history and Medications as related to this visit.      Review of Systems     Objective:   Physical Exam  GEN: Alert & Oriented, No acute distress Neck: no thyromegaly or LAD CV:  Regular Rate & Rhythm, no murmur Respiratory:  Normal work of breathing, CTAB Abd:  + BS, soft, no tenderness to palpation Ext: no pre-tibial edema Breast: normal Pelvic Exam:        External: normal female genitalia without lesions or masses        Vagina: normal without lesions or masses        Cervix: normal without lesions or masses        Adnexa: normal bimanual exam without masses or fullness        Uterus: normal by palpation      Assessment & Plan:  Annual physical:  Mammogram pending, given info on scheduling colonoscopy.  Counseled on overweight BMI and smoking cessation.  Will return for fasting bloodowkr.  Follow-up annual orearlier  as needed.

## 2012-03-24 NOTE — Patient Instructions (Signed)
Think about increasing exercise and weight bearing exercise to help keep you healthy!  Options for help with smoking: Dr. Pascal Lux for refresher on tips for quitting Dr. Raymondo Band to counsel and talk more about nicotine replacement therapies. [place] quit line, free, can offer nicotine replacement while supplies last:  1-800-QUIT-NOW  Return for fasting bloodwork to check diabetes and cholesterol (nothing but water for 8 hours)  Follow-up yearly or sooner if needed

## 2012-03-24 NOTE — Telephone Encounter (Signed)
Pt has the orange card and the doctor gave her numbers to call GI doctors but they don't take the orange card.  Wants to know if we can let her know who does.

## 2012-03-24 NOTE — Telephone Encounter (Signed)
Spoke with patient and informed her that [facility] is not accepting any referrals until June. Patient understands that we will not be able to send out until then.

## 2012-04-01 ENCOUNTER — Other Ambulatory Visit: Payer: No Typology Code available for payment source

## 2012-04-21 ENCOUNTER — Ambulatory Visit
Admission: RE | Admit: 2012-04-21 | Discharge: 2012-04-21 | Disposition: A | Discharge status: Home / Self Care | Payer: Self-pay | Source: Ambulatory Visit | Attending: Family Medicine | Admitting: Family Medicine

## 2012-04-21 DIAGNOSIS — Z1231 Encounter for screening mammogram for malignant neoplasm of breast: Secondary | ICD-10-CM

## 2012-04-22 ENCOUNTER — Other Ambulatory Visit: Payer: Self-pay

## 2012-04-22 ENCOUNTER — Ambulatory Visit: Payer: Self-pay

## 2012-04-22 DIAGNOSIS — Z13220 Encounter for screening for lipoid disorders: Secondary | ICD-10-CM

## 2012-04-22 DIAGNOSIS — Z131 Encounter for screening for diabetes mellitus: Secondary | ICD-10-CM

## 2012-04-22 LAB — COMPREHENSIVE METABOLIC PANEL
ALT: 18 U/L (ref 0–35)
CO2: 26 mEq/L (ref 19–32)
Creat: 0.7 mg/dL (ref 0.50–1.10)
Glucose, Bld: 81 mg/dL (ref 70–99)
Total Bilirubin: 0.9 mg/dL (ref 0.3–1.2)

## 2012-04-22 LAB — LIPID PANEL
Cholesterol: 144 mg/dL (ref 0–200)
Total CHOL/HDL Ratio: 3.3 Ratio
Triglycerides: 87 mg/dL (ref <150)
VLDL: 17 mg/dL (ref 0–40)

## 2012-04-22 NOTE — Progress Notes (Signed)
CMP AND FLP DONE TODAY Danielle Mccann 

## 2012-04-24 ENCOUNTER — Encounter: Payer: Self-pay | Admitting: Family Medicine

## 2012-05-15 ENCOUNTER — Telehealth: Payer: Self-pay | Admitting: Family Medicine

## 2012-05-15 NOTE — Telephone Encounter (Signed)
Patient is calling for a Referral for a Colonoscopy and needs to know of a place that takes the [facility] card.

## 2012-05-19 NOTE — Telephone Encounter (Signed)
Left message informing patient that there were no GI specialist available for the month of July through [facility] and we could try and process this referral again in August.

## 2012-06-12 NOTE — Telephone Encounter (Signed)
Spoke with patient and informed her that there are no GI doctor that accepts the orange card and that she will have to pay out of pocket. Will call [facility] and find out price for patient

## 2012-06-12 NOTE — Telephone Encounter (Addendum)
Pt is asking to see if she can be referred this month for her GI specialist.  Wants to know how we know who is available.

## 2012-10-30 ENCOUNTER — Ambulatory Visit (INDEPENDENT_AMBULATORY_CARE_PROVIDER_SITE_OTHER): Payer: Self-pay | Admitting: Family Medicine

## 2012-10-30 ENCOUNTER — Encounter: Payer: Self-pay | Admitting: Family Medicine

## 2012-10-30 ENCOUNTER — Other Ambulatory Visit (HOSPITAL_COMMUNITY)
Admission: RE | Admit: 2012-10-30 | Discharge: 2012-10-30 | Disposition: A | Discharge status: Home / Self Care | Payer: Self-pay | Source: Ambulatory Visit | Attending: Family Medicine | Admitting: Family Medicine

## 2012-10-30 VITALS — BP 122/75 | HR 74 | Temp 98.3°F | Ht 64.0 in | Wt 164.0 lb

## 2012-10-30 DIAGNOSIS — Z2089 Contact with and (suspected) exposure to other communicable diseases: Secondary | ICD-10-CM

## 2012-10-30 DIAGNOSIS — Z202 Contact with and (suspected) exposure to infections with a predominantly sexual mode of transmission: Secondary | ICD-10-CM

## 2012-10-30 DIAGNOSIS — Z7251 High risk heterosexual behavior: Secondary | ICD-10-CM

## 2012-10-30 DIAGNOSIS — Z113 Encounter for screening for infections with a predominantly sexual mode of transmission: Secondary | ICD-10-CM | POA: Insufficient documentation

## 2012-10-30 LAB — POCT WET PREP (WET MOUNT): Clue Cells Wet Prep Whiff POC: NEGATIVE

## 2012-10-30 NOTE — Progress Notes (Addendum)
  Subjective:    Patient ID: Danielle Mccann, female    DOB: [birth year], 51 y.o.   MRN: [id number]  HPI Here to evaluate STD.  Patient found out several weeks ago her boyfriend has had intercourse outside of their relationship.  She has heard rumors one of his partners may be HIV positive.  Denies change in health, vaginal discharge.   Review of Systems See HPI    Objective:   Physical Exam GEN: NAD Pelvic Exam:        External: normal female genitalia without lesions or masses        Vagina: normal without lesions or masses        Cervix: normal without lesions or masses        Adnexa: normal bimanual exam without masses or fullness        Uterus: normal by palpation        Samples for Wet prep, GC/Chlamydia obtained        Assessment & Plan:  Discussed with patient pos yeast.  Asymptomatic.  We decided not to treat

## 2012-10-30 NOTE — Assessment & Plan Note (Signed)
Will screen for HIV, RPR, GC/CHl/Trichomonas.  WIll also check Hep B and Hep C

## 2012-10-30 NOTE — Patient Instructions (Addendum)
Will check for gonorrhea, chlamydia, HIV, syphilis, hepatitis B and C  Will send you letter with normal results in 1-2 weeks  Will call you with positive results

## 2012-10-30 NOTE — Addendum Note (Signed)
Addended by: Macy Mis on: 10/30/2012 04:26 PM   Modules accepted: Orders

## 2012-11-10 ENCOUNTER — Encounter: Payer: Self-pay | Admitting: Family Medicine

## 2012-12-09 ENCOUNTER — Encounter: Payer: Self-pay | Admitting: Family Medicine

## 2012-12-09 ENCOUNTER — Ambulatory Visit (INDEPENDENT_AMBULATORY_CARE_PROVIDER_SITE_OTHER): Payer: Self-pay | Admitting: Family Medicine

## 2012-12-09 VITALS — BP 103/67 | HR 76 | Temp 98.4°F | Ht 64.0 in | Wt 165.0 lb

## 2012-12-09 DIAGNOSIS — E663 Overweight: Secondary | ICD-10-CM

## 2012-12-09 DIAGNOSIS — Z23 Encounter for immunization: Secondary | ICD-10-CM | POA: Insufficient documentation

## 2012-12-09 DIAGNOSIS — L089 Local infection of the skin and subcutaneous tissue, unspecified: Secondary | ICD-10-CM | POA: Insufficient documentation
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
## 2012-12-09 NOTE — Assessment & Plan Note (Signed)
Resolving.  Advised continued home care.

## 2012-12-09 NOTE — Progress Notes (Signed)
  Subjective:    Patient ID: Danielle Mccann, female    DOB: 1960-12-23, 52 y.o.   MRN: [id number]  HPI Finger infection:  Left index finger- was infected draining pus after tearing off a piece of skin.  Has been self treating with alcohol soaks.  Now much improved.  Wanted doctor to check on it to ensure healing.  No history of vesicle.   Concerned with poor appetite- inquires about need for appetite supplement  Works 3rd shift.  Eats dinner before going in, snacks throughout her shift but does not eat a email, then may pick up a breakfast biscuit on her way home.   NO symptoms of dysphagia or early satiety or abdominal pain.  Does not dislike eating or have symtpoms- sometimes forgets.   Not doing any exercise, people have noted on her weight loss.  Denies sadness, mood changes.  Wt Readings from Last 3 Encounters:  12/09/12 165 lb (74.844 kg)  10/30/12 164 lb (74.39 kg)  03/24/12 166 lb 8 oz (75.524 kg)   Hep B:  Immunity low, works in Engineer, site.  Does not recall every getting initial vaccination series.     Review of Systems See HPI    Objective:   Physical Exam GEN: Alert & Oriented, No acute distress CV:  Regular Rate & Rhythm, no murmur Respiratory:  Normal work of breathing, CTAB Abd:  + BS, soft, no tenderness to palpation Ext: no pre-tibial edema        Assessment & Plan:

## 2012-12-09 NOTE — Assessment & Plan Note (Signed)
Overweight with No weight loss, no red flags of early satiety or GI symptoms.  Advised although she does not sit down for many full meals, that she is likely getting adequate calories from snacking.  Advised to plan on bring nutritious meal/snack to eat while at work to schedule time to eat, and be cognizant of constant snacking at work - empty calories.  She plans on starting to go to gym with friends, I encouraged weight bearing exercise to maintain muscle mass

## 2012-12-09 NOTE — Patient Instructions (Addendum)
Hep B vaccination today, in 1 month, and 6 months from today  Will check labs to see how your body responded 1-2 months after you are finished with your shots  Try to eat every 4 hours when you are awake- we discussed some healthy snacks  If you feel you are not eating enough, keep a food log- sometimes snacks can add up  Try to add in exercise to keep your muscles strong  Due for physical at end of May

## 2013-01-22 ENCOUNTER — Ambulatory Visit (INDEPENDENT_AMBULATORY_CARE_PROVIDER_SITE_OTHER): Payer: Self-pay | Admitting: *Deleted

## 2013-01-22 DIAGNOSIS — Z289 Immunization not carried out for unspecified reason: Secondary | ICD-10-CM

## 2013-01-22 NOTE — Progress Notes (Addendum)
Patient in for Hep B # 2 . However she has no insurance and cannot pay out of pocket today. She has had [employer] in the past and it has expired. Advised that she will need to schedule appointment with financial  person Abundio Miu and she voices understanding.

## 2013-05-18 ENCOUNTER — Other Ambulatory Visit: Payer: Self-pay

## 2013-05-18 DIAGNOSIS — Z1231 Encounter for screening mammogram for malignant neoplasm of breast: Secondary | ICD-10-CM

## 2013-05-26 ENCOUNTER — Encounter: Payer: Self-pay | Admitting: Gastroenterology

## 2013-05-26 ENCOUNTER — Other Ambulatory Visit (HOSPITAL_COMMUNITY)
Admission: RE | Admit: 2013-05-26 | Discharge: 2013-05-26 | Disposition: A | Discharge status: Home / Self Care | Payer: No Typology Code available for payment source | Source: Ambulatory Visit | Attending: Family Medicine | Admitting: Family Medicine

## 2013-05-26 ENCOUNTER — Encounter: Payer: Self-pay | Admitting: Family Medicine

## 2013-05-26 ENCOUNTER — Ambulatory Visit (INDEPENDENT_AMBULATORY_CARE_PROVIDER_SITE_OTHER): Payer: No Typology Code available for payment source | Admitting: Family Medicine

## 2013-05-26 VITALS — BP 127/78 | HR 66 | Temp 98.5°F | Wt 161.0 lb

## 2013-05-26 DIAGNOSIS — Z0000 Encounter for general adult medical examination without abnormal findings: Secondary | ICD-10-CM

## 2013-05-26 DIAGNOSIS — Z23 Encounter for immunization: Secondary | ICD-10-CM

## 2013-05-26 DIAGNOSIS — Z1151 Encounter for screening for human papillomavirus (HPV): Secondary | ICD-10-CM | POA: Insufficient documentation

## 2013-05-26 DIAGNOSIS — Z124 Encounter for screening for malignant neoplasm of cervix: Secondary | ICD-10-CM

## 2013-05-26 DIAGNOSIS — F17210 Nicotine dependence, cigarettes, uncomplicated: Secondary | ICD-10-CM | POA: Insufficient documentation

## 2013-05-26 DIAGNOSIS — Z01419 Encounter for gynecological examination (general) (routine) without abnormal findings: Secondary | ICD-10-CM | POA: Insufficient documentation

## 2013-05-26 LAB — LIPID PANEL
Cholesterol: 137 mg/dL (ref 0–200)
HDL: 45 mg/dL (ref >39)
Triglycerides: 94 mg/dL (ref <150)

## 2013-05-26 LAB — BASIC METABOLIC PANEL
BUN: 9 mg/dL (ref 6–23)
Calcium: 9.4 mg/dL (ref 8.4–10.5)
Creat: 0.69 mg/dL (ref 0.50–1.10)

## 2013-05-26 NOTE — Patient Instructions (Signed)

## 2013-05-26 NOTE — Progress Notes (Signed)
Patient ID: Danielle Mccann, female   DOB: 1960/12/26, 52 y.o.   MRN: [id number] Subjective:     Danielle Mccann is a 52 y.o. female and is here for a comprehensive physical exam. The patient reports problems - loss of appetite and weight loss,she will like to quit smoking. G2P2 PAP 1 yr ago WUJ:WJXBJ 2 years ago.  History   Social History  . Marital Status: Single    Spouse Name: N/A    Number of Children: N/A  . Years of Education: N/A   Occupational History  . Not on file.   Social History Main Topics  . Smoking status: Current Every Day Smoker -- 0.50 packs/day    Types: Cigarettes  . Smokeless tobacco: Never Used  . Alcohol Use: No  . Drug Use: No  . Sexually Active: Not Currently     Comment: quit 11 years ago   Other Topics Concern  . Not on file   Social History Narrative   Lives with teenage children Ages 68 & 51.  Originally from [place]- brother ans sister here.  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     Health Maintenance  Topic Date Due  . Colonoscopy  04/12/2011  . Pap Smear  07/05/2013  . Influenza Vaccine  07/06/2013  . Mammogram  04/21/2014  . Tetanus/tdap  12/07/2015    The following portions of the patient's history were reviewed and updated as appropriate: allergies, current medications, past family history, past medical history, past social history, past surgical history and problem list.  Review of Systems Pertinent items are noted in HPI.   Objective:    BP 127/78  Pulse 66  Temp(Src) 98.5 F (36.9 C) (Oral)  Wt 161 lb (73.029 kg)  BMI 27.62 kg/m2 General appearance: alert, cooperative and appears stated age Head: Normocephalic, without obvious abnormality, atraumatic Eyes: conjunctivae/corneas clear. PERRL, EOM's intact. Fundi benign. Ears: normal TM's and external ear canals both ears Nose: Nares normal. Septum midline. Mucosa normal. No drainage or sinus tenderness. Throat: lips, mucosa,  and tongue normal; teeth and gums normal Neck: no adenopathy, no carotid bruit, no JVD, supple, symmetrical, trachea midline and thyroid not enlarged, symmetric, no tenderness/mass/nodules Lungs: clear to auscultation bilaterally Heart: regular rate and rhythm, S1, S2 normal, no murmur, click, rub or gallop Abdomen: soft, non-tender; bowel sounds normal; no masses,  no organomegaly Pelvic: cervix normal in appearance, external genitalia normal, no adnexal masses or tenderness, no cervical motion tenderness, rectovaginal septum normal, uterus normal size, shape, and consistency and vagina normal without discharge Extremities: extremities normal, atraumatic, no cyanosis or edema Pulses: 2+ and symmetric Skin: Skin color, texture, turgor normal. No rashes or lesions Lymph nodes: Cervical, supraclavicular, and axillary nodes normal. Neurologic: Grossly normal    Assessment:    Healthy female exam. Normal      Plan:     Normal exam. Weight,height and BMI reviewed and discussed with patient,she is actually overweight even though she is concern about 4 lbs weight loss over 5 months,I think that is ok. Smoking cessation and counseling done. She will try to quit without medication or try OTC Nicotine patch. To consider chantix at next visit. Pneumovax given due to chronic smoking. 2nd dose of Hep B also given today. F/U in 2 months for 3rd dose. Depression screening done today with a score of 0. Patient has mammogram appointment scheduled for Aug 6th. I referred her to GI for colonoscopy.PAP test done today. Lab today:  BMP,FLP. See After Visit Summary for Counseling Recommendations

## 2013-05-26 NOTE — Assessment & Plan Note (Addendum)
Normal exam. Weight,height and BMI reviewed and discussed with patient,she is actually overweight even though she is concern about 4 lbs weight loss over 5 months,I think that is ok. Smoking cessation and counseling done. She will try to quit without medication or try OTC Nicotine patch. To consider chantix at next visit. Pneumovax given due to chronic smoking.2nd dose of Hep B also given today. F/U in 2 months for 3rd dose. Depression screening done today with a score of 0. Patient has mammogram appointment scheduled for Aug 6th. I referred her to GI for colonoscopy.PAP test done today. Lab today: BMP,FLP.

## 2013-05-26 NOTE — Addendum Note (Signed)
Addended by: Jone Baseman D on: 05/26/2013 11:57 AM   Modules accepted: Orders

## 2013-06-10 ENCOUNTER — Ambulatory Visit: Payer: Self-pay

## 2013-06-29 ENCOUNTER — Ambulatory Visit
Admission: RE | Admit: 2013-06-29 | Discharge: 2013-06-29 | Disposition: A | Discharge status: Home / Self Care | Payer: No Typology Code available for payment source | Source: Ambulatory Visit

## 2013-06-29 DIAGNOSIS — Z1231 Encounter for screening mammogram for malignant neoplasm of breast: Secondary | ICD-10-CM

## 2013-07-08 ENCOUNTER — Ambulatory Visit (AMBULATORY_SURGERY_CENTER): Payer: Self-pay

## 2013-07-08 VITALS — Ht 64.0 in | Wt 161.8 lb

## 2013-07-08 DIAGNOSIS — Z1211 Encounter for screening for malignant neoplasm of colon: Secondary | ICD-10-CM

## 2013-07-08 MED ORDER — SOD PICOSULFATE-MAG OX-CIT ACD 10-3.5-12 MG-GM-GM PO PACK: 1.0000 | PACK | Freq: Once | ORAL | Status: DC

## 2013-07-15 ENCOUNTER — Encounter: Payer: Self-pay | Admitting: Gastroenterology

## 2013-07-22 ENCOUNTER — Encounter: Payer: Self-pay | Admitting: Gastroenterology

## 2013-07-22 ENCOUNTER — Ambulatory Visit (AMBULATORY_SURGERY_CENTER): Payer: No Typology Code available for payment source | Admitting: Gastroenterology

## 2013-07-22 VITALS — BP 108/71 | HR 62 | Temp 97.6°F | Resp 19 | Ht 64.0 in | Wt 161.0 lb

## 2013-07-22 DIAGNOSIS — D126 Benign neoplasm of colon, unspecified: Secondary | ICD-10-CM

## 2013-07-22 DIAGNOSIS — Z1211 Encounter for screening for malignant neoplasm of colon: Secondary | ICD-10-CM

## 2013-07-22 MED ORDER — SODIUM CHLORIDE 0.9 % IV SOLN: 500.0000 mL | INTRAVENOUS | Status: DC

## 2013-07-22 NOTE — Progress Notes (Signed)
Lidocaine-40mg IV prior to Propofol InductionPropofol given over incremental dosages 

## 2013-07-22 NOTE — Progress Notes (Signed)
Called to room to assist during endoscopic procedure.  Patient ID and intended procedure confirmed with present staff. Received instructions for my participation in the procedure from the performing physician.  

## 2013-07-22 NOTE — Patient Instructions (Addendum)
Impressions/recommendations:  Polyps (handout given) Hemorrhoids (handout given)  Repeat colonoscopy pending pathology results.  YOU HAD AN ENDOSCOPIC PROCEDURE TODAY AT THE [facility]: Refer to the procedure report that was given to you for any specific questions about what was found during the examination.  If the procedure report does not answer your questions, please call your gastroenterologist to clarify.  If you requested that your care partner not be given the details of your procedure findings, then the procedure report has been included in a sealed envelope for you to review at your convenience later.  YOU SHOULD EXPECT: Some feelings of bloating in the abdomen. Passage of more gas than usual.  Walking can help get rid of the air that was put into your GI tract during the procedure and reduce the bloating. If you had a lower endoscopy (such as a colonoscopy or flexible sigmoidoscopy) you may notice spotting of blood in your stool or on the toilet paper. If you underwent a bowel prep for your procedure, then you may not have a normal bowel movement for a few days.  DIET: Your first meal following the procedure should be a light meal and then it is ok to progress to your normal diet.  A half-sandwich or bowl of soup is an example of a good first meal.  Heavy or fried foods are harder to digest and may make you feel nauseous or bloated.  Likewise meals heavy in dairy and vegetables can cause extra gas to form and this can also increase the bloating.  Drink plenty of fluids but you should avoid alcoholic beverages for 24 hours.  ACTIVITY: Your care partner should take you home directly after the procedure.  You should plan to take it easy, moving slowly for the rest of the day.  You can resume normal activity the day after the procedure however you should NOT DRIVE or use heavy machinery for 24 hours (because of the sedation medicines used during the test).    SYMPTOMS TO REPORT  IMMEDIATELY: A gastroenterologist can be reached at any hour.  During normal business hours, 8:30 AM to 5:00 PM Monday through Friday, call [phone].  After hours and on weekends, please call the GI answering service at [phone] who will take a message and have the physician on call contact you.   Following lower endoscopy (colonoscopy or flexible sigmoidoscopy):  Excessive amounts of blood in the stool  Significant tenderness or worsening of abdominal pains  Swelling of the abdomen that is new, acute  Fever of 100F or higher   FOLLOW UP: If any biopsies were taken you will be contacted by phone or by letter within the next 1-3 weeks.  Call your gastroenterologist if you have not heard about the biopsies in 3 weeks.  Our staff will call the home number listed on your records the next business day following your procedure to check on you and address any questions or concerns that you may have at that time regarding the information given to you following your procedure. This is a courtesy call and so if there is no answer at the home number and we have not heard from you through the emergency physician on call, we will assume that you have returned to your regular daily activities without incident.  SIGNATURES/CONFIDENTIALITY: You and/or your care partner have signed paperwork which will be entered into your electronic medical record.  These signatures attest to the fact that that the information above on your After   Visit Summary has been reviewed and is understood.  Full responsibility of the confidentiality of this discharge information lies with you and/or your care-partner. 

## 2013-07-22 NOTE — Progress Notes (Signed)
Patient did not experience any of the following events: a burn prior to discharge; a fall within the facility; wrong site/side/patient/procedure/implant event; or a hospital transfer or hospital admission upon discharge from the facility. (G8907) Patient did not have preoperative order for IV antibiotic SSI prophylaxis. (G8918)  

## 2013-07-22 NOTE — Op Note (Signed)
[facility] [address] [place], [address]   COLONOSCOPY PROCEDURE REPORT  PATIENT: Danielle Mccann, Danielle Mccann  MR#: [id number] BIRTHDATE: [birth year] , 52  yrs. old GENDER: Female ENDOSCOPIST: Meryl Dare, MD, Clementeen Graham REFERRED BY: Janit Pagan, M.D. PROCEDURE DATE:  07/22/2013 PROCEDURE:   Colonoscopy with snare polypectomy First Screening Colonoscopy - Avg.  risk and is 50 yrs.  old or older Yes.  Prior Negative Screening - Now for repeat screening. N/A  History of Adenoma - Now for follow-up colonoscopy & has been > or = to 3 yrs.  N/A  Polyps Removed Today? Yes. ASA CLASS:   Class II INDICATIONS:average risk screening. MEDICATIONS: MAC sedation, administered by CRNA and propofol (Diprivan) 250mg  IV DESCRIPTION OF PROCEDURE:   After the risks benefits and alternatives of the procedure were thoroughly explained, informed consent was obtained.  A digital rectal exam revealed no abnormalities of the rectum.   The LB [id number]  endoscope was introduced through the anus and advanced to the cecum, which was identified by both the appendix and ileocecal valve. No adverse events experienced.   The quality of the prep was Prepopik excellent  The instrument was then slowly withdrawn as the colon was fully examined.  COLON FINDINGS: Four sessile polyps measuring 5-6 mm in size were found in the sigmoid colon.  A polypectomy was performed with a cold snare.  The resection was complete and the polyp tissue was completely retrieved.   The colon was otherwise normal.  There was no diverticulosis, inflammation, polyps or cancers unless previously stated.  Retroflexed views revealed small internal hemorrhoids. The time to cecum=3 minutes 28 seconds.  Withdrawal time=14 minutes 12 seconds.  The scope was withdrawn and the procedure completed.  COMPLICATIONS: There were no complications.  ENDOSCOPIC IMPRESSION: 1.   Four sessile polyps measuring 5-6 mm in the  sigmoid colon; polypectomy performed with a cold snare 2.   Small internal hemorrhoids  RECOMMENDATIONS: 1.  Await pathology results 2.  Repeat colonoscopy in 5 years if polyp(s) adenomatous; otherwise 10 years  eSigned:  Meryl Dare, MD, [facility] 07/22/2013 11:47 AM

## 2013-07-23 ENCOUNTER — Telehealth: Payer: Self-pay | Admitting: *Deleted

## 2013-07-23 NOTE — Telephone Encounter (Signed)
  Follow up Call-  Call back number 07/22/2013  Post procedure Call Back phone  # [phone]  Permission to leave phone message Yes     Patient questions:  Do you have a fever, pain , or abdominal swelling? no Pain Score  0 *  Have you tolerated food without any problems? yes  Have you been able to return to your normal activities? yes  Do you have any questions about your discharge instructions: Diet   no Medications  no Follow up visit  no  Do you have questions or concerns about your Care? no  Actions: * If pain score is 4 or above: No action needed, pain <4.

## 2013-07-28 ENCOUNTER — Encounter: Payer: Self-pay | Admitting: Gastroenterology

## 2013-09-10 ENCOUNTER — Ambulatory Visit (INDEPENDENT_AMBULATORY_CARE_PROVIDER_SITE_OTHER): Payer: No Typology Code available for payment source | Admitting: *Deleted

## 2013-09-10 DIAGNOSIS — Z23 Encounter for immunization: Secondary | ICD-10-CM

## 2013-11-09 ENCOUNTER — Ambulatory Visit (INDEPENDENT_AMBULATORY_CARE_PROVIDER_SITE_OTHER): Payer: No Typology Code available for payment source | Admitting: Family Medicine

## 2013-11-09 ENCOUNTER — Encounter: Payer: Self-pay | Admitting: Family Medicine

## 2013-11-09 VITALS — BP 149/78 | HR 76 | Temp 98.4°F | Ht 64.0 in | Wt 162.0 lb

## 2013-11-09 DIAGNOSIS — R350 Frequency of micturition: Secondary | ICD-10-CM

## 2013-11-09 DIAGNOSIS — M542 Cervicalgia: Secondary | ICD-10-CM

## 2013-11-09 DIAGNOSIS — IMO0001 Reserved for inherently not codable concepts without codable children: Secondary | ICD-10-CM

## 2013-11-09 DIAGNOSIS — R252 Cramp and spasm: Secondary | ICD-10-CM

## 2013-11-09 DIAGNOSIS — R300 Dysuria: Secondary | ICD-10-CM

## 2013-11-09 LAB — BASIC METABOLIC PANEL
BUN: 10 mg/dL (ref 6–23)
CHLORIDE: 102 meq/L (ref 96–112)
CO2: 28 meq/L (ref 19–32)
CREATININE: 0.61 mg/dL (ref 0.50–1.10)
Calcium: 9.5 mg/dL (ref 8.4–10.5)
Glucose, Bld: 91 mg/dL (ref 70–99)
POTASSIUM: 4.2 meq/L (ref 3.5–5.3)
SODIUM: 139 meq/L (ref 135–145)

## 2013-11-09 LAB — POCT URINALYSIS DIPSTICK
Bilirubin, UA: NEGATIVE
Glucose, UA: NEGATIVE
KETONES UA: NEGATIVE
Nitrite, UA: NEGATIVE
PH UA: 6
PROTEIN UA: NEGATIVE
Spec Grav, UA: 1.02
Urobilinogen, UA: 0.2

## 2013-11-09 LAB — POCT UA - MICROSCOPIC ONLY

## 2013-11-09 MED ORDER — NITROFURANTOIN MONOHYD MACRO 100 MG PO CAPS: 100.0000 mg | ORAL_CAPSULE | Freq: Two times a day (BID) | ORAL | Status: DC

## 2013-11-09 NOTE — Patient Instructions (Signed)
It was good to meet you.  For your urine,  - Take antibiotic twice daily. - Come back if you develop fevers or worsened symptoms.  For your neck pain, - I am prescribing massage by your boyfriend daily for 1 week. - Use a heating pad or hot bath daily in the evenings. - Do 10 minutes of neck stretching that we discussed. - Use aleve twice daily with a meal (do not use ibuprofen if using aleve). - Follow up with [place] in 1-2 weeks if not improving with the above exercises/home therapy.  For your leg cramps,  - We are checking your potassium and I will call you if it is NOT normal.  Muscle Strain A muscle strain (pulled muscle) happens when a muscle is stretched beyond normal length. Usually, a few of the fibers in your muscle are torn. Muscle strain is common in athletes. It happens when a sudden, violent force stretches your muscle too far. Recovery usually takes 1 2 weeks. Complete healing takes 5 6 weeks.  HOME CARE   Put ice on the sore muscle for the first 2 days after the injury.  Put ice in a plastic bag.  Place a towel between your skin and the bag.  Leave the ice on for 15-20 minutes at a time each hour.  Do not use the muscle if you have pain. Do not stress the muscle if you have pain.  Wrap the injured area with an elastic bandage for comfort. Do not put it on too tightly.  Only take medicine as told by your doctor. GET HELP RIGHT AWAY IF:  There is increased pain or puffiness (swelling) in the affected area. MAKE SURE YOU:   Understand these instructions.  Will watch your condition.  Will get help right away if you are not doing well or get worse. Document Released: 07/31/2008 Document Revised: 07/16/2012 Document Reviewed: 05/05/2012 [facility] Patient Information 2014 [place].

## 2013-11-09 NOTE — Progress Notes (Signed)
Patient ID: Danielle Mccann, female   DOB: 12/08/1960, 53 y.o.   MRN: [id number] Subjective:   CC: Odor when urinates, right neck/shoulder pain  HPI:   1. Odor when urinates - Patient complains of ~1 month of an odor when she urinates, and more recent 1/10 pelvic discomfort at end of her stream and mild increased frequency. She denies dysuria, abnormal discharge, back pain, fevers, chills, skin changes, or other changes. Denies new foods, medications, or dehydration. In fact, she is trying harder to stay hydrated. Odor embarrasses her and concerns her some.  2. Right neck/shoulder pain - Pt states pain came on suddenly and remembers a particular day (12/12) when she first noticed it and it was 10/10. Pain is intermittently worsened by position (laying on right side, holding phone, typing on computer) or stress and she feels it is musculoskeletal. She also feels stiffness. Over time, it has decreased and is now 1/10. 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 Biofreeze also helps slightly. Denies erythema, swelling, deformity, weakness, numbness, or tingling, or radiation. She reports a stressful job with an unpleasant boss.   3. Left leg cramping - Patient reports sudden left leg cramping that only occurs when she lays down and feels like a charlie horse. She denies swelling or erythema or dyspnea.  Review of Systems - Per HPI.   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    Objective:  Physical Exam BP 149/78  Pulse 76  Temp(Src) 98.4 F (36.9 C) (Oral)  Ht 5\' 4"  (1.626 m)  Wt 162 lb (73.483 kg)  BMI 27.79 kg/m2 GEN: NAD HEENT: Atraumatic, normocephalic, neck supple, EOMI, sclera clear  PULM: normal effort ABD: Soft, nontender, nondistended, NABS, no organomegaly, no suprapubic tenderness BACK: No CVA tenderness SKIN: No rash or cyanosis; warm and well-perfused MSK: No lower  extremity erythema, edema or calf tenderness currently; No spinal tenderness; no obvious deformity, swelling, warmth, or erythema; Paraspinal muscles at cervical spine somewhat tender but with relief on palpation, full neck and shoulder ROM, strength 5/5 bilateral upper extremities, sensation grossly intact PSYCH: Mood and affect euthymic, normal rate and volume of speech NEURO: Awake, alert, no focal deficits grossly, normal speech    Assessment:     Danielle Mccann is a 53 y.o. female here for urinary symptoms, neck/back pain, and leg cramps.    Plan:     # See problem list and after visit summary for problem-specific plans.   # Health Maintenance: Not discussed.  Follow-up: Follow up in 1-2 weeks prn no improvement in neck pain or urinary symptoms or sooner PRN.    Hilton Sinclair, MD [place]

## 2013-11-10 DIAGNOSIS — M542 Cervicalgia: Secondary | ICD-10-CM | POA: Insufficient documentation

## 2013-11-10 NOTE — Assessment & Plan Note (Addendum)
Right>left; Likely MSK given increased stress and working at computer, improved with rest and massage. No numbness/tingling/weakness. - Prescribed daily massage, heating pad, and 5-10 minutes 2 times daily of neck/shoulder ROM exercises (rotate shoulders forward and backward, let head hang like a heavy weight) - Aleve BID or ibuprofen - Given reported chronic ibuprofen use, will check BMET - F/u 1-2 weeks if no improvement; return precautions reviewed.

## 2013-11-10 NOTE — Assessment & Plan Note (Signed)
No signs/symptoms of injury or DVT. No history of claudication as sx occur at rest when laying down. Possible hypokalemia or hypomagnesemia or RLS.  - Check BMET for K - If low, check Mg. If K WNL, check CBC for anemia and TSH - If all normal, can try ropinerole for RLS

## 2013-11-10 NOTE — Assessment & Plan Note (Signed)
With frequency and odor, though no dysuria, fever, or systemic symptoms. - Will rx macrobid BID x 7 days. - F/u prn worsening.

## 2013-11-13 ENCOUNTER — Telehealth: Payer: Self-pay | Admitting: Family Medicine

## 2013-11-13 NOTE — Telephone Encounter (Signed)
UA results were relatively normal but with symptoms it is still appropriate for her to be treated and I had sent this for culture so we will await those results.  Also, her K was normal. For her leg pain, it would be worth checking TSH and CBC at f/u.  Danielle Sinclair, MD
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
## 2014-04-05 ENCOUNTER — Ambulatory Visit: Payer: No Typology Code available for payment source | Admitting: Family Medicine

## 2014-04-07 ENCOUNTER — Ambulatory Visit (INDEPENDENT_AMBULATORY_CARE_PROVIDER_SITE_OTHER): Payer: BC Managed Care – PPO | Admitting: Family Medicine

## 2014-04-07 ENCOUNTER — Encounter: Payer: Self-pay | Admitting: Family Medicine

## 2014-04-07 VITALS — BP 124/70 | HR 87 | Temp 98.3°F | Wt 158.0 lb

## 2014-04-07 DIAGNOSIS — R300 Dysuria: Secondary | ICD-10-CM

## 2014-04-07 DIAGNOSIS — N390 Urinary tract infection, site not specified: Secondary | ICD-10-CM

## 2014-04-07 LAB — POCT URINALYSIS DIPSTICK
BILIRUBIN UA: NEGATIVE
GLUCOSE UA: NEGATIVE
KETONES UA: NEGATIVE
Nitrite, UA: POSITIVE
Protein, UA: 100
SPEC GRAV UA: 1.015
Urobilinogen, UA: 0.2
pH, UA: 5.5

## 2014-04-07 LAB — POCT UA - MICROSCOPIC ONLY

## 2014-04-07 MED ORDER — CEPHALEXIN 500 MG PO CAPS: 500.0000 mg | ORAL_CAPSULE | Freq: Three times a day (TID) | ORAL | Status: DC

## 2014-04-07 NOTE — Patient Instructions (Signed)
Take Keflex three times per day for one week. We will call you if the culture shows anything different Take over the counter AZO if needed F/u if you do not get better.  Ambriel Gorelick M. Tamani Durney, M.D.

## 2014-04-07 NOTE — Assessment & Plan Note (Signed)
A: UA shows UTI. No s/sx of pyleo or urosepsis  P: - Keflex TID x 1 week (printed for her to take to [facility]) - OTC AZO prn - Push fluids - Will culture urine - F/u if fails to improve or gets worse

## 2014-04-07 NOTE — Addendum Note (Signed)
Addended by: [place] Pink on: 04/07/2014 11:21 AM   Modules accepted: Orders

## 2014-04-07 NOTE — Progress Notes (Signed)
Patient ID: Danielle Mccann, female   DOB: [birth year], 53 y.o.   MRN: [id number]    Subjective: HPI: Patient is a 53 y.o. female presenting to clinic today for same day appointment for urinary symptoms  Urinary Tract Infection Patient complains of abnormal smelling urine, burning with urination and hematuria. She has had symptoms for several days. Patient also complains of chills a few days ago, none currently. Patient denies back pain, fever and stomach ache. Patient does have a history of recurrent UTI. Patient does not have a history of pyelonephritis.    History Reviewed: Daily smoker.  ROS: Please see HPI above.  Objective: Office vital signs reviewed. BP 124/70  Pulse 87  Temp(Src) 98.3 F (36.8 C) (Oral)  Wt 158 lb (71.668 kg)  Physical Examination:  General: Awake, alert. NAD HEENT: Atraumatic, normocephalic. MMM Heart: RRR, no murmur Lungs: CTAB Abd: Soft, nontender Neuro: Strength and sensation grossly normal   Assessment: 53 y.o. female with UTI  Plan: See Problem List and After Visit Summary

## 2014-04-10 LAB — URINE CULTURE

## 2014-05-28 ENCOUNTER — Encounter: Payer: BC Managed Care – PPO | Admitting: Family Medicine

## 2014-05-28 ENCOUNTER — Ambulatory Visit (INDEPENDENT_AMBULATORY_CARE_PROVIDER_SITE_OTHER): Payer: BC Managed Care – PPO | Admitting: Family Medicine

## 2014-05-29 NOTE — Progress Notes (Signed)
Patient ID: Danielle Mccann, female   DOB: 11-27-1960, 53 y.o.   MRN: [id number]

## 2014-06-04 ENCOUNTER — Encounter: Payer: Self-pay | Admitting: Family Medicine

## 2014-06-04 ENCOUNTER — Other Ambulatory Visit (HOSPITAL_COMMUNITY)
Admission: RE | Admit: 2014-06-04 | Discharge: 2014-06-04 | Disposition: A | Discharge status: Home / Self Care | Payer: BC Managed Care – PPO | Source: Ambulatory Visit | Attending: Family Medicine | Admitting: Family Medicine

## 2014-06-04 ENCOUNTER — Ambulatory Visit (INDEPENDENT_AMBULATORY_CARE_PROVIDER_SITE_OTHER): Payer: BC Managed Care – PPO | Admitting: Family Medicine

## 2014-06-04 ENCOUNTER — Encounter: Payer: BC Managed Care – PPO | Admitting: Family Medicine

## 2014-06-04 VITALS — BP 133/59 | HR 61 | Temp 98.8°F | Ht 64.0 in | Wt 160.3 lb

## 2014-06-04 DIAGNOSIS — R8290 Unspecified abnormal findings in urine: Secondary | ICD-10-CM

## 2014-06-04 DIAGNOSIS — B9689 Other specified bacterial agents as the cause of diseases classified elsewhere: Secondary | ICD-10-CM

## 2014-06-04 DIAGNOSIS — Z202 Contact with and (suspected) exposure to infections with a predominantly sexual mode of transmission: Secondary | ICD-10-CM

## 2014-06-04 DIAGNOSIS — A499 Bacterial infection, unspecified: Secondary | ICD-10-CM

## 2014-06-04 DIAGNOSIS — Z780 Asymptomatic menopausal state: Secondary | ICD-10-CM

## 2014-06-04 DIAGNOSIS — N951 Menopausal and female climacteric states: Secondary | ICD-10-CM | POA: Diagnosis not present

## 2014-06-04 DIAGNOSIS — N760 Acute vaginitis: Secondary | ICD-10-CM | POA: Insufficient documentation

## 2014-06-04 DIAGNOSIS — R82998 Other abnormal findings in urine: Secondary | ICD-10-CM

## 2014-06-04 DIAGNOSIS — Z113 Encounter for screening for infections with a predominantly sexual mode of transmission: Secondary | ICD-10-CM | POA: Insufficient documentation

## 2014-06-04 LAB — POCT URINALYSIS DIPSTICK
Bilirubin, UA: NEGATIVE
Glucose, UA: NEGATIVE
KETONES UA: NEGATIVE
Leukocytes, UA: NEGATIVE
Nitrite, UA: NEGATIVE
PROTEIN UA: NEGATIVE
RBC UA: NEGATIVE
SPEC GRAV UA: 1.015
UROBILINOGEN UA: 0.2
pH, UA: 5

## 2014-06-04 LAB — POCT WET PREP (WET MOUNT): CLUE CELLS WET PREP WHIFF POC: POSITIVE

## 2014-06-04 MED ORDER — METRONIDAZOLE 500 MG PO TABS: 500.0000 mg | ORAL_TABLET | Freq: Two times a day (BID) | ORAL | Status: DC

## 2014-06-04 NOTE — Progress Notes (Signed)
   Subjective:    Patient ID: Danielle Mccann, female    DOB: 04/20/1961, 53 y.o.   MRN: [id number]  HPI Third visit for foul odor urine.  2 previous events treated with Keflex for UTI.  One has clear documentation, one has possible UTI. No vag discharge. Sexually active - wants STD check. No pain    Review of Systems     Objective:   Physical Exam Abd benign. Pelvic + discharge.  Cervix normal No uterine tenderness.       Assessment & Plan:

## 2014-06-04 NOTE — Patient Instructions (Signed)
You have bacterial vaginosis I sent in a prescription for an antibiotic that should clear it up. You do not have a urine infection this time If you keep having these problems you may want to talk to Dr. Gwendlyn Deutscher about premarin (estrogen) vaginal cream I will call Monday about the test results.

## 2014-06-04 NOTE — Assessment & Plan Note (Signed)
These malodorous spells only began post menopause.  If they continue to be frequent, consider estrogen cream.

## 2014-06-04 NOTE — Assessment & Plan Note (Signed)
Check for STD

## 2014-06-04 NOTE — Assessment & Plan Note (Signed)
Flagyl

## 2014-06-05 LAB — HEPATITIS C ANTIBODY, REFLEX: HCV AB: NEGATIVE

## 2014-06-05 LAB — HIV ANTIBODY (ROUTINE TESTING W REFLEX): HIV 1&2 Ab, 4th Generation: NONREACTIVE

## 2014-06-05 LAB — RPR

## 2014-06-30 ENCOUNTER — Ambulatory Visit (INDEPENDENT_AMBULATORY_CARE_PROVIDER_SITE_OTHER): Payer: BC Managed Care – PPO | Admitting: Family Medicine

## 2014-06-30 ENCOUNTER — Encounter: Payer: Self-pay | Admitting: Family Medicine

## 2014-06-30 VITALS — BP 131/77 | HR 67 | Temp 98.1°F | Wt 159.0 lb

## 2014-06-30 DIAGNOSIS — R82998 Other abnormal findings in urine: Secondary | ICD-10-CM

## 2014-06-30 DIAGNOSIS — Z111 Encounter for screening for respiratory tuberculosis: Secondary | ICD-10-CM | POA: Diagnosis not present

## 2014-06-30 DIAGNOSIS — R8290 Unspecified abnormal findings in urine: Secondary | ICD-10-CM | POA: Insufficient documentation

## 2014-06-30 DIAGNOSIS — R300 Dysuria: Secondary | ICD-10-CM

## 2014-06-30 LAB — POCT URINALYSIS DIPSTICK
BILIRUBIN UA: NEGATIVE
Blood, UA: NEGATIVE
Glucose, UA: NEGATIVE
Ketones, UA: NEGATIVE
LEUKOCYTES UA: NEGATIVE
NITRITE UA: POSITIVE
PH UA: 5.5
Protein, UA: NEGATIVE
Spec Grav, UA: 1.015
UROBILINOGEN UA: 0.2

## 2014-06-30 LAB — BASIC METABOLIC PANEL
BUN: 11 mg/dL (ref 6–23)
CHLORIDE: 104 meq/L (ref 96–112)
CO2: 28 meq/L (ref 19–32)
Calcium: 9.4 mg/dL (ref 8.4–10.5)
Creat: 0.63 mg/dL (ref 0.50–1.10)
GLUCOSE: 78 mg/dL (ref 70–99)
POTASSIUM: 4.1 meq/L (ref 3.5–5.3)
Sodium: 138 mEq/L (ref 135–145)

## 2014-06-30 LAB — POCT UA - MICROSCOPIC ONLY

## 2014-06-30 MED ORDER — NITROFURANTOIN MONOHYD MACRO 100 MG PO CAPS: 100.0000 mg | ORAL_CAPSULE | Freq: Two times a day (BID) | ORAL | Status: DC

## 2014-06-30 MED ORDER — ESTROGENS, CONJUGATED 0.625 MG/GM VA CREA: 1.0000 | TOPICAL_CREAM | Freq: Every day | VAGINAL | Status: DC

## 2014-06-30 NOTE — Progress Notes (Signed)
Patient ID: Danielle Mccann, female   DOB: 1961-06-07, 53 y.o.   MRN: [id number] Subjective:   CC: Bad odor to urine  HPI:   Patient is a 53 y.o. female with bad odor to her urine for the past 7 months for which she has been seen 4 times in clinic. She denies abnormal discharge, fever, chills, dysuria, abdominal pain, vaginal pain, new back pain, new rash, new sexual partner, or feeling at risk for STD. Odor usually occurs at the end of a void but not every time and it has slowly been improving. Urine is occasionally orange. At her last visit, she had STD testing and it was negative. She does nto drink much water. She takes ibuprofen <once weekly. She was dosed flagyl for BV which did not help. She was also dosed abx for recent UTI which briefly helped. She denies diarrhea or constipation, nausea or vomiting. She used to douche frequently but stopped 2-3 months ago.  Review of Systems - Per HPI.   PMH: Reviewed Smoking status: Smoker < 1/2 PPD    Objective:  Physical Exam BP 131/77  Pulse 67  Temp(Src) 98.1 F (36.7 C) (Oral)  Wt 159 lb (72.122 kg) GEN: NAD ABD: Soft, nontender, mildly distended, no organomegaly GU: External genitalia WNL, no odor, urethra WNL, no discharge, erythema, or tenderness    Assessment:     Danielle Mccann is a 53 y.o. female with h/o tobacco abuse here for eval of bad odor of urine.    Plan:     Bad odor of urine Likely from poor hydration status; could also be due to UTI. With no discharge and very recent negative testing for trichomonas and gc/chlamydia, unlikely related to STD/vaginal infection. Pt very concerned about kidney function. Could also be WNL odor for vaginal flora. - UA>>nitrites. Will treat with macrobid given odor - BMET to eval renal fcn. - estrogen cream trial. - Improve hydration. - cotton underwear, change twice daily. - return in 1 month for re-eval if no improvement.  Follow-up: Follow up in 1 month.   Hilton Sinclair, MD [place]

## 2014-06-30 NOTE — Patient Instructions (Addendum)
Your urine was positive with signs of possible infection. We will treat this since your odor has been going on so long. I also recommend vaginal estrogen cream. This comes with an applicator to put in your vagina. The pharmacist can show you how to use it. Drink plenty of water throughout the day to be sure you do not get dehydrated because this will likely cause odor and concentrated appearance. Wear cotton underwear and change when you bathe but one other time as well, to help keep it ventilated. Return in 1 month if no improvement. I will call if your lab is NOT normal.  Hilton Sinclair, MD

## 2014-07-01 NOTE — Assessment & Plan Note (Signed)
Likely from poor hydration status; could also be due to UTI. With no discharge and very recent negative testing for trichomonas and gc/chlamydia, unlikely related to STD/vaginal infection. Pt very concerned about kidney function. Could also be WNL odor for vaginal flora. - UA>>nitrites. Will treat with macrobid given odor - BMET to eval renal fcn. - estrogen cream trial. - Improve hydration. - cotton underwear, change twice daily. - return in 1 month for re-eval if no improvement.

## 2014-07-01 NOTE — Progress Notes (Signed)
Reviewed

## 2014-07-02 ENCOUNTER — Ambulatory Visit (INDEPENDENT_AMBULATORY_CARE_PROVIDER_SITE_OTHER): Payer: BC Managed Care – PPO | Admitting: *Deleted

## 2014-07-02 ENCOUNTER — Encounter: Payer: Self-pay | Admitting: *Deleted

## 2014-07-02 DIAGNOSIS — Z111 Encounter for screening for respiratory tuberculosis: Secondary | ICD-10-CM

## 2014-07-02 LAB — TB SKIN TEST
Induration: 0 mm
TB Skin Test: NEGATIVE

## 2014-07-02 NOTE — Progress Notes (Signed)
   Pt in nurse clinic for PPD reading; 0 MM; negative. Derl Barrow, RN

## 2014-07-06 ENCOUNTER — Ambulatory Visit (INDEPENDENT_AMBULATORY_CARE_PROVIDER_SITE_OTHER): Payer: BC Managed Care – PPO | Admitting: Family Medicine

## 2014-07-06 ENCOUNTER — Encounter: Payer: Self-pay | Admitting: Family Medicine

## 2014-07-06 VITALS — BP 125/70 | HR 65 | Temp 98.3°F | Wt 161.0 lb

## 2014-07-06 DIAGNOSIS — IMO0001 Reserved for inherently not codable concepts without codable children: Secondary | ICD-10-CM

## 2014-07-06 DIAGNOSIS — R82998 Other abnormal findings in urine: Secondary | ICD-10-CM

## 2014-07-06 DIAGNOSIS — R8290 Unspecified abnormal findings in urine: Secondary | ICD-10-CM

## 2014-07-06 DIAGNOSIS — Z0000 Encounter for general adult medical examination without abnormal findings: Secondary | ICD-10-CM

## 2014-07-06 LAB — POCT URINALYSIS DIPSTICK
Bilirubin, UA: NEGATIVE
Glucose, UA: NEGATIVE
KETONES UA: NEGATIVE
Nitrite, UA: NEGATIVE
PH UA: 5.5
PROTEIN UA: NEGATIVE
RBC UA: NEGATIVE
SPEC GRAV UA: 1.025
Urobilinogen, UA: 0.2

## 2014-07-06 LAB — POCT UA - MICROSCOPIC ONLY

## 2014-07-06 MED ORDER — CIPROFLOXACIN HCL 500 MG PO TABS: 500.0000 mg | ORAL_TABLET | Freq: Two times a day (BID) | ORAL | Status: DC

## 2014-07-06 NOTE — Progress Notes (Signed)
Patient ID: Danielle Mccann, female   DOB: 07/07/1961, 53 y.o.   MRN: [id number] Subjective:     Danielle Mccann is a 53 y.o. female and is here for a comprehensive physical exam. The patient reports problems - Urine problem. She was sent home on Microbid but she never got this medication due to financial issue.  History   Social History  . Marital Status: Single    Spouse Name: N/A    Number of Children: N/A  . Years of Education: N/A   Occupational History  . Not on file.   Social History Main Topics  . Smoking status: Current Every Day Smoker -- 0.50 packs/day    Types: Cigarettes  . Smokeless tobacco: Never Used  . Alcohol Use: No  . Drug Use: No  . Sexual Activity: Not Currently     Comment: quit 11 years ago   Other Topics Concern  . Not on file   Social History Narrative   Lives with teenage children Ages 22 & 55.  Originally from [place]- brother ans sister here.  Works at [employer] as a Engineer, manufacturing systems.     Health Maintenance  Topic Date Due  . Influenza Vaccine  06/05/2014  . Mammogram  06/30/2015  . Tetanus/tdap  12/07/2015  . Pap Smear  05/26/2016  . Colonoscopy  07/23/2023    The following portions of the patient's history were reviewed and updated as appropriate: allergies, current medications, past family history, past medical history, past social history, past surgical history and problem list.  Review of Systems Pertinent items are noted in HPI.   Objective:    BP 125/70  Pulse 65  Temp(Src) 98.3 F (36.8 C) (Oral)  Wt 161 lb (73.029 kg) General appearance: alert Head: Normocephalic, without obvious abnormality, atraumatic Eyes: conjunctivae/corneas clear. PERRL, EOM's intact. Fundi benign. Ears: normal TM's and external ear canals both ears Throat: lips, mucosa, and tongue normal; teeth and gums normal Neck: no adenopathy, no carotid bruit, no JVD, supple, symmetrical, trachea midline and thyroid not  enlarged, symmetric, no tenderness/mass/nodules Lungs: clear to auscultation bilaterally Heart: regular rate and rhythm, S1, S2 normal, no murmur, click, rub or gallop Abdomen: soft, non-tender; bowel sounds normal; no masses,  no organomegaly Extremities: extremities normal, atraumatic, no cyanosis or edema Pulses: 2+ and symmetric Neurologic: Alert and oriented X 3, normal strength and tone. Normal symmetric reflexes. Normal coordination and gait    Assessment:    Healthy female exam. Normal exam    Urine odor   Plan:    Up to date with PAP and colonoscopy. Need to schedule mammogram. She agreed to call and schedule. Reschedule appointment for flu vaccine when available.  Urine dipstick checked for urine odor ( Trace leukocyte, + bacteria) Urine sent to lab for culture. Switched to Ciprofloxacin which should be cheaper pending culture report. F/U prn. See After Visit Summary for Counseling Recommendations

## 2014-07-06 NOTE — Patient Instructions (Signed)

## 2014-07-09 ENCOUNTER — Telehealth: Payer: Self-pay | Admitting: Family Medicine

## 2014-07-09 LAB — URINE CULTURE: Colony Count: >=100000

## 2014-07-09 NOTE — Telephone Encounter (Signed)
Pt informed. Danielle Mccann  

## 2014-07-09 NOTE — Telephone Encounter (Signed)
Message left for her to call back about urine culture report.  Result: + bacterial in urine, name of bacteria is Klebsiella.             The antibiotic I prescribed should be effective.              Please complete antibiotic and return in 1-2 wks for reassessment.

## 2014-07-09 NOTE — Telephone Encounter (Signed)
Urine culture result discussed with patient. She already started Ciprofloxacin, today is her last dose and her symptoms has resolved. She is instructed to follow up for repeat UA.

## 2014-07-14 ENCOUNTER — Other Ambulatory Visit: Payer: Self-pay

## 2014-07-14 DIAGNOSIS — Z1231 Encounter for screening mammogram for malignant neoplasm of breast: Secondary | ICD-10-CM

## 2014-07-27 ENCOUNTER — Ambulatory Visit: Payer: BC Managed Care – PPO | Admitting: Family Medicine

## 2014-07-27 ENCOUNTER — Ambulatory Visit
Admission: RE | Admit: 2014-07-27 | Discharge: 2014-07-27 | Disposition: A | Discharge status: Home / Self Care | Payer: BC Managed Care – PPO | Source: Ambulatory Visit

## 2014-07-27 DIAGNOSIS — Z1231 Encounter for screening mammogram for malignant neoplasm of breast: Secondary | ICD-10-CM

## 2014-08-04 ENCOUNTER — Encounter: Payer: Self-pay | Admitting: Family Medicine

## 2014-09-25 ENCOUNTER — Encounter (HOSPITAL_COMMUNITY): Payer: Self-pay | Admitting: Emergency Medicine

## 2014-09-25 ENCOUNTER — Emergency Department (HOSPITAL_COMMUNITY)
Admission: EM | Admit: 2014-09-25 | Discharge: 2014-09-25 | Disposition: A | Discharge status: Home / Self Care | Payer: BC Managed Care – PPO | Attending: Emergency Medicine | Admitting: Emergency Medicine

## 2014-09-25 DIAGNOSIS — Z720 Tobacco use: Secondary | ICD-10-CM | POA: Diagnosis not present

## 2014-09-25 DIAGNOSIS — Y9389 Activity, other specified: Secondary | ICD-10-CM | POA: Diagnosis not present

## 2014-09-25 DIAGNOSIS — S3992XA Unspecified injury of lower back, initial encounter: Secondary | ICD-10-CM | POA: Diagnosis not present

## 2014-09-25 DIAGNOSIS — Y92410 Unspecified street and highway as the place of occurrence of the external cause: Secondary | ICD-10-CM | POA: Diagnosis not present

## 2014-09-25 DIAGNOSIS — Z79899 Other long term (current) drug therapy: Secondary | ICD-10-CM | POA: Insufficient documentation

## 2014-09-25 DIAGNOSIS — Z792 Long term (current) use of antibiotics: Secondary | ICD-10-CM | POA: Diagnosis not present

## 2014-09-25 DIAGNOSIS — Y998 Other external cause status: Secondary | ICD-10-CM | POA: Diagnosis not present

## 2014-09-25 MED ORDER — DIAZEPAM 5 MG PO TABS: 5.0000 mg | ORAL_TABLET | Freq: Two times a day (BID) | ORAL | Status: AC

## 2014-09-25 MED ORDER — IBUPROFEN 800 MG PO TABS: 800.0000 mg | ORAL_TABLET | Freq: Three times a day (TID) | ORAL | Status: AC
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
## 2014-10-04 ENCOUNTER — Ambulatory Visit (INDEPENDENT_AMBULATORY_CARE_PROVIDER_SITE_OTHER): Payer: BC Managed Care – PPO | Admitting: Pharmacist

## 2014-10-04 ENCOUNTER — Encounter: Payer: Self-pay | Admitting: Pharmacist

## 2014-10-04 VITALS — BP 120/84 | HR 79 | Wt 161.7 lb

## 2014-10-04 DIAGNOSIS — F17210 Nicotine dependence, cigarettes, uncomplicated: Secondary | ICD-10-CM | POA: Diagnosis not present

## 2014-10-04 DIAGNOSIS — Z720 Tobacco use: Secondary | ICD-10-CM

## 2014-10-04 DIAGNOSIS — Z780 Asymptomatic menopausal state: Secondary | ICD-10-CM | POA: Diagnosis not present

## 2014-10-04 DIAGNOSIS — F17200 Nicotine dependence, unspecified, uncomplicated: Secondary | ICD-10-CM

## 2014-10-04 MED ORDER — NICOTINE 21 MG/24HR TD PT24: 21.0000 mg | MEDICATED_PATCH | Freq: Every day | TRANSDERMAL | Status: DC

## 2014-10-04 MED ORDER — ESTROGENS, CONJUGATED 0.625 MG/GM VA CREA: 1.0000 | TOPICAL_CREAM | Freq: Every day | VAGINAL | Status: DC

## 2014-10-04 MED ORDER — BUPROPION HCL ER (XL) 150 MG PO TB24: 150.0000 mg | ORAL_TABLET | Freq: Every day | ORAL | Status: DC

## 2014-10-04 NOTE — Progress Notes (Signed)
Patient ID: Danielle Mccann, female   DOB: 07-Feb-1961, 53 y.o.   MRN: [id number] Reviewed: Agree with Dr. Graylin Shiver documentation and management.

## 2014-10-04 NOTE — Patient Instructions (Signed)
Thanks for coming in today.   Quitting smoking is one of most important things you can do for your health.   Start taking bupropion (Wellbutrin) 150mg  once daily in the morning.  After 7 days increase to taking two pills each morning.   Cut down your smoking over the next week to half-pack Start nicotine patch 21 mg on your quit date and stop smoking.   Next visit - early January.

## 2014-10-04 NOTE — Progress Notes (Signed)
S:  Patient arrives with positive attitude about quitting smoking.   We have worked together in the past when she was smoking 1/2 pack per day and she was able to quit for 3 years.   This past quit attempt utilized bupropion (Wellbutrin) and she would like to try this again.   She arrives for evaluation/assistance with tobacco dependence.   She reports a significant level of stress interacting with her 53 year old daughter.    She reports many challenges in dealing with her 'high-achieving" daughter, specifically around the patient's boyfriend "Grayland Ormond".   Grayland Ormond is a smoker who is NOT interested in quitting at this time but he is supportive of the patient's quit attempt.    Number of Cigarettes per day 20. Estimated Nicotine intake per day 20mg .   Most recent quit attempt 2 years ago Longest time ever been tobacco free 3 years. Medications used in past includes bupropion and would like to try again.   Triggers to use tobacco include; Habit and Stress.      A/P: moderate Nicotine Dependence of many years duration in a patient who is good candidate for success b/c of past success and current level of commitment.  She is stress with interacting with her daughter and boyfriend.    Initiated bupropion tx with 150mg  XL daily then to be increased to 300mg  XL daily by taking two QAM.  Plan to reorder 300mg  XL formulation if patient tolerates the initial dosing.  Denies history of seizures. Patient counseled on purpose, proper use, and potential adverse effects, including insomnia, appetite suppression and potential change in mood.   Plan to initiate nicotine replacement tx with 21mg  on the patient's quit date which is scheduled for the day or day after the patient drops of her teanage daughter for her holiday trip [place]. Patient counseled on purpose, proper use, and potential adverse effects.  Written information provided. Provided information on 1 800-QUIT NOW support program.  F/U Rx Clinic Visit in early  January.    Total time in face-to-face counseling 35 minutes.  Patient seen with Nicoletta Ba, PharmD resident.

## 2014-10-04 NOTE — Assessment & Plan Note (Signed)
Reorders premarin vaginal cream which patient had never picked up or used after prescription was provided.

## 2014-10-04 NOTE — Assessment & Plan Note (Signed)
moderate Nicotine Dependence of many years duration in a patient who is good candidate for success b/c of past success and current level of commitment.  She is stress with interacting with her daughter and boyfriend.    Initiated bupropion tx with 150mg  XL daily then to be increased to 300mg  XL daily by taking two QAM.  Plan to reorder 300mg  XL formulation if patient tolerates the initial dosing.  Denies history of seizures. Patient counseled on purpose, proper use, and potential adverse effects, including insomnia, appetite suppression and potential change in mood.   Plan to initiate nicotine replacement tx with 21mg  on the patient's quit date which is scheduled for the day or day after the patient drops of her teanage daughter for her holiday trip [place]. Patient counseled on purpose, proper use, and potential adverse effects.  Written information provided. Provided information on 1 800-QUIT NOW support program.  F/U Rx Clinic Visit in early January.    Total time in face-to-face counseling 35 minutes.  Patient seen with Nicoletta Ba, PharmD resident.

## 2014-10-04 NOTE — Progress Notes (Signed)
Reviewed

## 2014-11-09 ENCOUNTER — Ambulatory Visit: Payer: BC Managed Care – PPO | Admitting: Pharmacist

## 2015-02-01 ENCOUNTER — Ambulatory Visit: Payer: Self-pay | Admitting: Family Medicine

## 2015-02-04 ENCOUNTER — Encounter: Payer: Self-pay | Admitting: Family Medicine

## 2015-02-04 ENCOUNTER — Ambulatory Visit (INDEPENDENT_AMBULATORY_CARE_PROVIDER_SITE_OTHER): Payer: BLUE CROSS/BLUE SHIELD | Admitting: Family Medicine

## 2015-02-04 VITALS — BP 113/67 | HR 65 | Temp 98.7°F | Ht 64.0 in | Wt 173.0 lb

## 2015-02-04 DIAGNOSIS — R29898 Other symptoms and signs involving the musculoskeletal system: Secondary | ICD-10-CM

## 2015-02-04 DIAGNOSIS — G4700 Insomnia, unspecified: Secondary | ICD-10-CM | POA: Insufficient documentation

## 2015-02-04 MED ORDER — TRAZODONE HCL 50 MG PO TABS: 25.0000 mg | ORAL_TABLET | Freq: Every evening | ORAL | Status: DC | PRN
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
## 2015-02-04 NOTE — Assessment & Plan Note (Signed)
Intermittent. Neuro exam completely normal. ?? Intermittent nerve compression with standing. I recommended PT for now. If no improvement she might need MRI of lower back (although no back pain) vs Neurology referral. F/U in 4 wks or sooner if symptom worsens. Return precaution given.

## 2015-02-04 NOTE — Assessment & Plan Note (Signed)
Trazodone prn insomnia prescribed today.

## 2015-02-04 NOTE — Patient Instructions (Signed)
It was nice seeing you, I am sorry you don't feel ok with your legs, my exam for you today looks good, I will recommend we try physical therapy for now and if no improvement we can do more testing.  For you sleep problem we will have you try Trazodone as needed for sleep.

## 2015-03-15 ENCOUNTER — Ambulatory Visit: Payer: BLUE CROSS/BLUE SHIELD | Admitting: Physical Therapy

## 2015-04-07 ENCOUNTER — Ambulatory Visit: Payer: BLUE CROSS/BLUE SHIELD | Admitting: Physical Therapy

## 2015-04-12 ENCOUNTER — Ambulatory Visit: Payer: BLUE CROSS/BLUE SHIELD | Admitting: Physical Therapy

## 2015-04-19 ENCOUNTER — Emergency Department (HOSPITAL_COMMUNITY)
Admission: EM | Admit: 2015-04-19 | Discharge: 2015-04-19 | Disposition: A | Discharge status: Home / Self Care | Payer: BLUE CROSS/BLUE SHIELD | Attending: Emergency Medicine | Admitting: Emergency Medicine

## 2015-04-19 ENCOUNTER — Emergency Department (HOSPITAL_COMMUNITY): Payer: BLUE CROSS/BLUE SHIELD

## 2015-04-19 ENCOUNTER — Encounter (HOSPITAL_COMMUNITY): Payer: Self-pay | Admitting: Emergency Medicine

## 2015-04-19 DIAGNOSIS — Y998 Other external cause status: Secondary | ICD-10-CM | POA: Diagnosis not present

## 2015-04-19 DIAGNOSIS — Y92481 Parking lot as the place of occurrence of the external cause: Secondary | ICD-10-CM | POA: Diagnosis not present

## 2015-04-19 DIAGNOSIS — S3992XA Unspecified injury of lower back, initial encounter: Secondary | ICD-10-CM | POA: Insufficient documentation

## 2015-04-19 DIAGNOSIS — S24109A Unspecified injury at unspecified level of thoracic spinal cord, initial encounter: Secondary | ICD-10-CM | POA: Insufficient documentation

## 2015-04-19 DIAGNOSIS — Z720 Tobacco use: Secondary | ICD-10-CM | POA: Diagnosis not present

## 2015-04-19 DIAGNOSIS — M545 Low back pain, unspecified: Secondary | ICD-10-CM

## 2015-04-19 DIAGNOSIS — Y9389 Activity, other specified: Secondary | ICD-10-CM | POA: Diagnosis not present

## 2015-04-19 MED ORDER — CYCLOBENZAPRINE HCL 10 MG PO TABS: 5.0000 mg | ORAL_TABLET | Freq: Two times a day (BID) | ORAL | Status: DC | PRN

## 2015-04-19 MED ORDER — ACETAMINOPHEN 325 MG PO TABS
650.0000 mg | ORAL_TABLET | Freq: Once | ORAL | Status: AC
  Administered 2015-04-19: 650 mg via ORAL
  Filled 2015-04-19: qty 2
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
## 2015-04-19 NOTE — ED Notes (Signed)
Patient transported to X-ray 
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
## 2015-04-26 ENCOUNTER — Ambulatory Visit: Payer: BLUE CROSS/BLUE SHIELD | Attending: Physical Therapy | Admitting: Physical Therapy

## 2015-08-18 ENCOUNTER — Telehealth: Payer: Self-pay | Admitting: Family Medicine
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
## 2015-08-18 NOTE — Telephone Encounter (Signed)
Document of her in home assessment reviewed. Last wellness exam was 04/2014.   Please contact patient to schedule wellness visit and for immunization up date.

## 2015-11-08 ENCOUNTER — Ambulatory Visit (INDEPENDENT_AMBULATORY_CARE_PROVIDER_SITE_OTHER): Payer: Managed Care, Other (non HMO) | Admitting: Family Medicine

## 2015-11-08 ENCOUNTER — Encounter: Payer: Self-pay | Admitting: Family Medicine

## 2015-11-08 VITALS — BP 125/48 | HR 63 | Temp 98.3°F | Resp 16 | Ht 64.0 in | Wt 178.0 lb

## 2015-11-08 DIAGNOSIS — Z0000 Encounter for general adult medical examination without abnormal findings: Secondary | ICD-10-CM | POA: Diagnosis not present

## 2015-11-08 DIAGNOSIS — Z23 Encounter for immunization: Secondary | ICD-10-CM

## 2015-11-08 LAB — LIPID PANEL
CHOL/HDL RATIO: 3 ratio (ref <=5.0)
CHOLESTEROL: 134 mg/dL (ref 125–200)
HDL: 45 mg/dL — ABNORMAL LOW (ref >=46)
LDL Cholesterol: 67 mg/dL (ref <130)
TRIGLYCERIDES: 112 mg/dL (ref <150)
VLDL: 22 mg/dL (ref <30)

## 2015-11-08 LAB — BASIC METABOLIC PANEL
BUN: 10 mg/dL (ref 7–25)
CALCIUM: 8.9 mg/dL (ref 8.6–10.4)
CHLORIDE: 103 mmol/L (ref 98–110)
CO2: 27 mmol/L (ref 20–31)
CREATININE: 0.74 mg/dL (ref 0.50–1.05)
Glucose, Bld: 232 mg/dL — ABNORMAL HIGH (ref 65–99)
Potassium: 4.2 mmol/L (ref 3.5–5.3)
Sodium: 140 mmol/L (ref 135–146)

## 2015-11-08 NOTE — Progress Notes (Signed)
Patient ID: Danielle Mccann, female   DOB: 07-Apr-1961, 54 y.o.   MRN: [id number] Subjective:     Danielle Mccann is a 55 y.o. female and is here for a comprehensive physical exam. The patient reports problems - mole of the left side of the lip no change in size but a new growth on it which is worrisome..  Social History   Social History  . Marital Status: Single    Spouse Name: N/A  . Number of Children: N/A  . Years of Education: N/A   Occupational History  . Not on file.   Social History Main Topics  . Smoking status: Current Every Day Smoker -- 0.50 packs/day    Types: Cigarettes  . Smokeless tobacco: Never Used  . Alcohol Use: No  . Drug Use: No  . Sexual Activity: Not Currently     Comment: quit 11 years ago   Other Topics Concern  . Not on file   Social History Narrative   Lives with teenage children Ages 66 & 77.  Originally from [place]- brother ans sister here.  Works at [employer] as a Engineer, manufacturing systems.     Health Maintenance  Topic Date Due  . INFLUENZA VACCINE  06/06/2015  . TETANUS/TDAP  12/07/2015  . PAP SMEAR  05/26/2016  . MAMMOGRAM  07/27/2016  . COLONOSCOPY  07/23/2023  . Hepatitis C Screening  Completed  . HIV Screening  Completed    The following portions of the patient's history were reviewed and updated as appropriate: allergies, current medications, past family history, past medical history, past social history, past surgical history and problem list.  Review of Systems Pertinent items are noted in HPI.   Objective:    BP 125/48 mmHg  Pulse 63  Temp(Src) 98.3 F (36.8 C) (Oral)  Resp 16  Ht 5\' 4"  (1.626 m)  Wt 178 lb (80.74 kg)  BMI 30.54 kg/m2 General appearance: alert, cooperative and appears stated age Head: Normocephalic, without obvious abnormality, atraumatic Eyes: conjunctivae/corneas clear. PERRL, EOM's intact. Fundi benign. Ears: normal TM's and external ear canals both ears Throat: lips,  mucosa, and tongue normal; teeth and gums normal Neck: no adenopathy, no carotid bruit, no JVD, supple, symmetrical, trachea midline and thyroid not enlarged, symmetric, no tenderness/mass/nodules Lungs: clear to auscultation bilaterally Heart: regular rate and rhythm, S1, S2 normal, no murmur, click, rub or gallop Abdomen: soft, non-tender; bowel sounds normal; no masses,  no organomegaly Extremities: extremities normal, atraumatic, no cyanosis or edema Skin: Skin color, texture, turgor normal. No rashes or lesions or 1-2 cm flat round hyperpigmented mole on the left side of her lep. Neurologic: Alert and oriented X 3, normal strength and tone. Normal symmetric reflexes. Normal coordination and gait    Assessment:    Healthy female exam.     Skin mole Plan:     Normal physical exam. Up to date with PAP and colonoscopy. Mammogram recommended and instruction given on how to schedule it. Tdap given. She declined flu shot today. F/U at [facility] clinic for mole removal. Weight loss counseling done for BMI of 30. Exercise and diet counseling done. F/U in 6-7 months for PAP.

## 2015-11-08 NOTE — Patient Instructions (Signed)
  Please remember to schedule appointment at our derm clinic for mole removal.  Exercising to Lose Weight Exercising can help you to lose weight. In order to lose weight through exercise, you need to do vigorous-intensity exercise. You can tell that you are exercising with vigorous intensity if you are breathing very hard and fast and cannot hold a conversation while exercising. Moderate-intensity exercise helps to maintain your current weight. You can tell that you are exercising at a moderate level if you have a higher heart rate and faster breathing, but you are still able to hold a conversation. HOW OFTEN SHOULD I EXERCISE? Choose an activity that you enjoy and set realistic goals. Your health care provider can help you to make an activity plan that works for you. Exercise regularly as directed by your health care provider. This may include:  Doing resistance training twice each week, such as:  Push-ups.  Sit-ups.  Lifting weights.  Using resistance bands.  Doing a given intensity of exercise for a given amount of time. Choose from these options:  150 minutes of moderate-intensity exercise every week.  75 minutes of vigorous-intensity exercise every week.  A mix of moderate-intensity and vigorous-intensity exercise every week. Children, pregnant women, people who are out of shape, people who are overweight, and older adults may need to consult a health care provider for individual recommendations. If you have any sort of medical condition, be sure to consult your health care provider before starting a new exercise program. WHAT ARE SOME ACTIVITIES THAT CAN HELP ME TO LOSE WEIGHT?   Walking at a rate of at least 4.5 miles an hour.  Jogging or running at a rate of 5 miles per hour.  Biking at a rate of at least 10 miles per hour.  Lap swimming.  Roller-skating or in-line skating.  Cross-country skiing.  Vigorous competitive sports, such as football, basketball, and  soccer.  Jumping rope.  Aerobic dancing. HOW CAN I BE MORE ACTIVE IN MY DAY-TO-DAY ACTIVITIES?  Use the stairs instead of the elevator.  Take a walk during your lunch break.  If you drive, park your car farther away from work or school.  If you take public transportation, get off one stop early and walk the rest of the way.  Make all of your phone calls while standing up and walking around.  Get up, stretch, and walk around every 30 minutes throughout the day. WHAT GUIDELINES SHOULD I FOLLOW WHILE EXERCISING?  Do not exercise so much that you hurt yourself, feel dizzy, or get very short of breath.  Consult your health care provider prior to starting a new exercise program.  Wear comfortable clothes and shoes with good support.  Drink plenty of water while you exercise to prevent dehydration or heat stroke. Body water is lost during exercise and must be replaced.  Work out until you breathe faster and your heart beats faster.   This information is not intended to replace advice given to you by your health care provider. Make sure you discuss any questions you have with your health care provider.   Document Released: 11/24/2010 Document Revised: 11/12/2014 Document Reviewed: 03/25/2014 Elsevier Interactive Patient Education [employer].

## 2015-11-09 ENCOUNTER — Telehealth: Payer: Self-pay | Admitting: Family Medicine

## 2015-11-09 DIAGNOSIS — R739 Hyperglycemia, unspecified: Secondary | ICD-10-CM

## 2015-11-09 DIAGNOSIS — Z131 Encounter for screening for diabetes mellitus: Secondary | ICD-10-CM

## 2015-11-09 NOTE — Telephone Encounter (Signed)
I spoke with patient and discussed result. FLP looks ok. Glucose is pretty elevated at 232, I recommended A1C check. She will schedule a lab visit and I will place a future order.

## 2015-11-10 ENCOUNTER — Other Ambulatory Visit (INDEPENDENT_AMBULATORY_CARE_PROVIDER_SITE_OTHER): Payer: Managed Care, Other (non HMO)

## 2015-11-10 ENCOUNTER — Telehealth: Payer: Self-pay | Admitting: Family Medicine

## 2015-11-10 DIAGNOSIS — R739 Hyperglycemia, unspecified: Secondary | ICD-10-CM | POA: Diagnosis not present

## 2015-11-10 DIAGNOSIS — Z131 Encounter for screening for diabetes mellitus: Secondary | ICD-10-CM

## 2015-11-10 LAB — POCT GLYCOSYLATED HEMOGLOBIN (HGB A1C): Hemoglobin A1C: 6.2

## 2015-11-10 NOTE — Telephone Encounter (Signed)
I spoke with patient to discuss test result.  A1C 6.2: Prediabetes. Diet and exercise for weight loss recommended. Repeat A1c in 6 months. She verbalized understanding.

## 2015-12-07 ENCOUNTER — Ambulatory Visit (INDEPENDENT_AMBULATORY_CARE_PROVIDER_SITE_OTHER): Payer: Managed Care, Other (non HMO) | Admitting: Family Medicine

## 2015-12-07 VITALS — BP 112/61 | HR 66 | Temp 98.1°F | Ht 64.0 in | Wt 177.0 lb

## 2015-12-07 DIAGNOSIS — D489 Neoplasm of uncertain behavior, unspecified: Secondary | ICD-10-CM

## 2015-12-07 NOTE — Assessment & Plan Note (Addendum)
A growing 6 mm by 10 mm well-cricumcized skin lesion below her lower lip on the left side is concerning for neoplasm.  Also new small lesion about 2 mm in diameter growing out of the the background lesion.  Did shave biopsy today. Sent sample for pathology. Applied pressure dressing. Discussed about wound care.

## 2015-12-07 NOTE — Patient Instructions (Signed)
It was great seeing you today! We did a shave biopsy to remove the skin mole on your face. We are sending the sample to laboratory for study. We will give you a call as soon as we get the result back. We recommend you keep the wound clean and dry for the next couple of days. You can also use the bandage we gave you to cover.  If you have any questions or concerns before then, please call the clinic at [phone].  Sign up for My Chart to have easy access to your labs results, and communication with your Primary care physician.    Please check-out at the front desk before leaving the clinic.   Take Care,

## 2015-12-07 NOTE — Progress Notes (Signed)
   Subjective:    Patient ID: Danielle Mccann, female    DOB: [birth year], 55 y.o.   MRN: [id number]  HPI Patient presents as a referral by PCP for skin lesion on her face. This has been stable in the past but growing in size recently. She also has small lesion about 2 mm in diameter growing out of the the background lesion. This has been bothering her a lot. 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 She denies having another lesion elsewhere. She declined a full body skin exam today.  She denies history of easy bruising and excessive bleeding. She is not taking blood thinner. Review of Systems No fever or recent illness    Objective:   Physical Exam  Filed Vitals:   12/07/15 1549  BP: 112/61  Pulse: 66  Temp: 98.1 F (36.7 C)  TempSrc: Oral  Height: 5\' 4"  (1.626 m)  Weight: 177 lb (80.287 kg)   Skin: 6 mm by 10 mm well-cricumcized skin lesion right below her lower lip on the left side, appears darker than surrounding skin.  Small lesion about 2 mm in diameter growing out of the the background lesion  Procedure note: Shave biopsy Discussed with the patient about the risk and benefits of the procedure. Obtained written consent. The area is prepped and draped in sterile fashion. Anesthestized with 1% lidocaine and epinephrine using 27 gauge, 1.5 inch needle. Did shave biopsy along the margin 10 mm by 6 mm lesion using a 15 gauge scalpel. Hemostasis was achieved silver nitrate and pressure dressing. There is no complication after the procedure. The wound covered by 2x2 cotton gauze. Patient tolerated the procedure well.   Assessment & Plan:  Neoplasm of uncertain behavior A growing 6 mm by 10 mm well-cricumcized skin lesion below her lower lip on the left side is concerning for neoplasm.  Also new small lesion about 2 mm in diameter growing out of the the background lesion.  Did shave biopsy today. Sent sample for pathology. Applied pressure dressing. Discussed about  wound care.

## 2015-12-10 ENCOUNTER — Telehealth: Payer: Self-pay | Admitting: Student

## 2015-12-10 ENCOUNTER — Encounter: Payer: Self-pay | Admitting: Student

## 2015-12-10 NOTE — Telephone Encounter (Signed)
Called patient about her result from skin biopsy we did three days ago. Patient didn't answer. Left voice mail and sent letter through epic.

## 2016-01-09 ENCOUNTER — Other Ambulatory Visit: Payer: Self-pay

## 2016-01-09 DIAGNOSIS — Z1231 Encounter for screening mammogram for malignant neoplasm of breast: Secondary | ICD-10-CM

## 2016-01-18 ENCOUNTER — Ambulatory Visit
Admission: RE | Admit: 2016-01-18 | Discharge: 2016-01-18 | Disposition: A | Discharge status: Home / Self Care | Payer: Managed Care, Other (non HMO) | Source: Ambulatory Visit

## 2016-01-18 DIAGNOSIS — Z1231 Encounter for screening mammogram for malignant neoplasm of breast: Secondary | ICD-10-CM

## 2016-07-20 ENCOUNTER — Other Ambulatory Visit (HOSPITAL_COMMUNITY)
Admission: RE | Admit: 2016-07-20 | Discharge: 2016-07-20 | Disposition: A | Discharge status: Home / Self Care | Payer: Managed Care, Other (non HMO) | Source: Ambulatory Visit | Attending: Family Medicine | Admitting: Family Medicine

## 2016-07-20 ENCOUNTER — Ambulatory Visit (INDEPENDENT_AMBULATORY_CARE_PROVIDER_SITE_OTHER): Payer: Managed Care, Other (non HMO) | Admitting: Family Medicine

## 2016-07-20 ENCOUNTER — Encounter: Payer: Self-pay | Admitting: Family Medicine

## 2016-07-20 VITALS — BP 127/63 | HR 62 | Temp 98.2°F | Wt 172.0 lb

## 2016-07-20 DIAGNOSIS — E119 Type 2 diabetes mellitus without complications: Secondary | ICD-10-CM | POA: Insufficient documentation

## 2016-07-20 DIAGNOSIS — R7303 Prediabetes: Secondary | ICD-10-CM

## 2016-07-20 DIAGNOSIS — R739 Hyperglycemia, unspecified: Secondary | ICD-10-CM | POA: Diagnosis not present

## 2016-07-20 DIAGNOSIS — Z1151 Encounter for screening for human papillomavirus (HPV): Secondary | ICD-10-CM | POA: Diagnosis present

## 2016-07-20 DIAGNOSIS — Z124 Encounter for screening for malignant neoplasm of cervix: Secondary | ICD-10-CM | POA: Diagnosis not present

## 2016-07-20 DIAGNOSIS — R32 Unspecified urinary incontinence: Secondary | ICD-10-CM | POA: Diagnosis not present

## 2016-07-20 DIAGNOSIS — Z01419 Encounter for gynecological examination (general) (routine) without abnormal findings: Secondary | ICD-10-CM | POA: Diagnosis present

## 2016-07-20 DIAGNOSIS — R350 Frequency of micturition: Secondary | ICD-10-CM

## 2016-07-20 DIAGNOSIS — E118 Type 2 diabetes mellitus with unspecified complications: Secondary | ICD-10-CM

## 2016-07-20 LAB — POCT URINALYSIS DIPSTICK
Bilirubin, UA: NEGATIVE
Glucose, UA: 500
Ketones, UA: NEGATIVE
Leukocytes, UA: NEGATIVE
Nitrite, UA: NEGATIVE
PROTEIN UA: NEGATIVE
UROBILINOGEN UA: 0.2
pH, UA: 5.5

## 2016-07-20 LAB — POCT GLYCOSYLATED HEMOGLOBIN (HGB A1C): Hemoglobin A1C: 9.9

## 2016-07-20 LAB — BASIC METABOLIC PANEL WITH GFR
BUN: 11 mg/dL (ref 7–25)
CHLORIDE: 97 mmol/L — AB (ref 98–110)
CO2: 26 mmol/L (ref 20–31)
CREATININE: 0.82 mg/dL (ref 0.50–1.05)
Calcium: 8.9 mg/dL (ref 8.6–10.4)
GFR, EST NON AFRICAN AMERICAN: 81 mL/min (ref >=60)
GFR, Est African American: >89 mL/min (ref >=60)
Glucose, Bld: 402 mg/dL — ABNORMAL HIGH (ref 65–99)
Potassium: 4.3 mmol/L (ref 3.5–5.3)
SODIUM: 132 mmol/L — AB (ref 135–146)

## 2016-07-20 LAB — POCT UA - MICROSCOPIC ONLY

## 2016-07-20 MED ORDER — FREESTYLE LANCETS MISC: 12 refills | Status: DC

## 2016-07-20 MED ORDER — METFORMIN HCL 500 MG PO TABS: 500.0000 mg | ORAL_TABLET | Freq: Two times a day (BID) | ORAL | 1 refills | Status: DC

## 2016-07-20 MED ORDER — FREESTYLE SYSTEM KIT: PACK | 0 refills | Status: AC

## 2016-07-20 MED ORDER — FREESTYLE CONTROL SOLUTION VI LIQD: 5 refills | Status: DC

## 2016-07-20 MED ORDER — GLUCOSE BLOOD VI STRP
ORAL_STRIP | 12 refills | Status: DC
Start: 2016-07-20 — End: 2017-09-24

## 2016-07-20 NOTE — Patient Instructions (Signed)
Nice seeing you today. I am sorry about your new diagnosis of diabetes. I will recommend we have you start Metformin. Please call Dr. Jenne Campus to schedule nutrition appointment. I will like to see you in 3 moths.  Diabetes Mellitus and Food It is important for you to manage your blood sugar (glucose) level. Your blood glucose level can be greatly affected by what you eat. Eating healthier foods in the appropriate amounts throughout the day at about the same time each day will help you control your blood glucose level. It can also help slow or prevent worsening of your diabetes mellitus. Healthy eating may even help you improve the level of your blood pressure and reach or maintain a healthy weight.  General recommendations for healthful eating and cooking habits include:  Eating meals and snacks regularly. Avoid going long periods of time without eating to lose weight.  Eating a diet that consists mainly of plant-based foods, such as fruits, vegetables, nuts, legumes, and whole grains.  Using low-heat cooking methods, such as baking, instead of high-heat cooking methods, such as deep frying. Work with your dietitian to make sure you understand how to use the Nutrition Facts information on food labels. HOW CAN FOOD AFFECT ME? Carbohydrates Carbohydrates affect your blood glucose level more than any other type of food. Your dietitian will help you determine how many carbohydrates to eat at each meal and teach you how to count carbohydrates. Counting carbohydrates is important to keep your blood glucose at a healthy level, especially if you are using insulin or taking certain medicines for diabetes mellitus. Alcohol Alcohol can cause sudden decreases in blood glucose (hypoglycemia), especially if you use insulin or take certain medicines for diabetes mellitus. Hypoglycemia can be a life-threatening condition. Symptoms of hypoglycemia (sleepiness, dizziness, and disorientation) are similar to symptoms of  having too much alcohol.  If your health care provider has given you approval to drink alcohol, do so in moderation and use the following guidelines:  Women should not have more than one drink per day, and men should not have more than two drinks per day. One drink is equal to:  12 oz of beer.  5 oz of wine.  1 oz of hard liquor.  Do not drink on an empty stomach.  Keep yourself hydrated. Have water, diet soda, or unsweetened iced tea.  Regular soda, juice, and other mixers might contain a lot of carbohydrates and should be counted. WHAT FOODS ARE NOT RECOMMENDED? As you make food choices, it is important to remember that all foods are not the same. Some foods have fewer nutrients per serving than other foods, even though they might have the same number of calories or carbohydrates. It is difficult to get your body what it needs when you eat foods with fewer nutrients. Examples of foods that you should avoid that are high in calories and carbohydrates but low in nutrients include:  Trans fats (most processed foods list trans fats on the Nutrition Facts label).  Regular soda.  Juice.  Candy.  Sweets, such as cake, pie, doughnuts, and cookies.  Fried foods. WHAT FOODS CAN I EAT? Eat nutrient-rich foods, which will nourish your body and keep you healthy. The food you should eat also will depend on several factors, including:  The calories you need.  The medicines you take.  Your weight.  Your blood glucose level.  Your blood pressure level.  Your cholesterol level. You should eat a variety of foods, including:  Protein.  [employer]  cuts of meat.  Proteins low in saturated fats, such as fish, egg whites, and beans. Avoid processed meats.  Fruits and vegetables.  Fruits and vegetables that may help control blood glucose levels, such as apples, mangoes, and yams.  Dairy products.  Choose fat-free or low-fat dairy products, such as milk, yogurt, and cheese.  Grains,  bread, pasta, and rice.  Choose whole grain products, such as multigrain bread, whole oats, and brown rice. These foods may help control blood pressure.  Fats.  Foods containing healthful fats, such as nuts, avocado, olive oil, canola oil, and fish. DOES EVERYONE WITH DIABETES MELLITUS HAVE THE SAME MEAL PLAN? Because every person with diabetes mellitus is different, there is not one meal plan that works for everyone. It is very important that you meet with a dietitian who will help you create a meal plan that is just right for you.   This information is not intended to replace advice given to you by your health care provider. Make sure you discuss any questions you have with your health care provider.   Document Released: 07/19/2005 Document Revised: 11/12/2014 Document Reviewed: 09/18/2013 Elsevier Interactive Patient Education [employer].

## 2016-07-20 NOTE — Addendum Note (Signed)
Addended by: Andrena Mews T on: 07/20/2016 02:33 PM   Modules accepted: Orders

## 2016-07-20 NOTE — Progress Notes (Signed)
Subjective:     Patient ID: Danielle Mccann, female   DOB: 11-Oct-1961, 55 y.o.   MRN: [id number]  HPI Urinary frequency: can't hold her urine x 1 month or so. At times she will need to pull over while driving. No loss of urine with coughing, sneezing or pressure. Denies hesitancy. She gets thirsty and she drinks a lot of water. Denies abdominal pain, no fever.  Pre-DM: Here for follow up. She eats fast food. She does not drink a lot of soda, more water drinking. HM: Need PAP and flu shot. Denies other GU symptoms.  No current outpatient prescriptions on file prior to visit.   No current facility-administered medications on file prior to visit.    Past Medical History:  Diagnosis Date  . Smoking 1/2 pack a day or less      Review of Systems  Constitutional: Negative.   Respiratory: Negative.   Cardiovascular: Negative.   Gastrointestinal: Negative.   Genitourinary: Positive for frequency. Negative for difficulty urinating, dysuria, flank pain, hematuria, urgency and vaginal discharge.       Vitals:   07/20/16 0843  BP: 127/63  Pulse: 62  Temp: 98.2 F (36.8 C)  TempSrc: Oral  Weight: 172 lb (78 kg)    Objective:   Physical Exam  Constitutional: She appears well-developed. No distress.  Cardiovascular: Normal rate, regular rhythm and normal heart sounds.   No murmur heard. Pulmonary/Chest: Effort normal and breath sounds normal. No respiratory distress. She has no wheezes. She exhibits no tenderness.  Abdominal: Soft. Bowel sounds are normal. She exhibits no distension and no mass. There is no tenderness.  Genitourinary: Vagina normal and uterus normal. Cervix exhibits no motion tenderness and no discharge. Right adnexum displays no mass. Left adnexum displays no mass.  Nursing note and vitals reviewed.      Urinalysis    Component Value Date/Time   COLORURINE yellow 04/07/2008 1522   APPEARANCEUR Clear 04/07/2008 1522   LABSPEC 1.015 04/07/2008 1522   PHURINE 5.5 04/07/2008 1522   HGBUR negative 04/07/2008 1522   BILIRUBINUR NEG 07/20/2016 0833   PROTEINUR NEG 07/20/2016 0833   UROBILINOGEN 0.2 07/20/2016 0833   UROBILINOGEN 1.0 04/07/2008 1522   NITRITE NEG 07/20/2016 0833   NITRITE negative 04/07/2008 1522   LEUKOCYTESUR Negative 07/20/2016 0833     Assessment:     Urinary Frequency: Pre-DM: New onset DM HM:     Plan:     Check problem list.  For her health maintenance, flu shot offered but she declined.

## 2016-07-20 NOTE — Assessment & Plan Note (Addendum)
New onset DM. A1C today was 9.9. Brief diet and exercise counseling done. She was started on Metformin 500 mg BID. Will order Glucometer. She is advised to check CBG daily, if > 180 consistently to see me back in 4 wks. Bmet, urine microalbumin checked. Instruction given to call Dr. Jenne Campus for nutrition counseling. Referral placed. F/U in 3 months for reassessment. Return precaution discussed.

## 2016-07-20 NOTE — Assessment & Plan Note (Signed)
Likely related to excessive fluid intake from polydipsia. UA with no sign of infection. Likely related to DM.

## 2016-07-20 NOTE — Addendum Note (Signed)
Addended by: Andrena Mews T on: 07/20/2016 11:34 AM   Modules accepted: Orders

## 2016-07-20 NOTE — Assessment & Plan Note (Signed)
PAP completed, I will contact her with result.

## 2016-07-21 ENCOUNTER — Telehealth: Payer: Self-pay | Admitting: Family Medicine

## 2016-07-21 LAB — MICROALBUMIN / CREATININE URINE RATIO
Creatinine, Urine: 41 mg/dL (ref 20–320)
MICROALB UR: 0.4 mg/dL
MICROALB/CREAT RATIO: 10 ug/mg{creat} (ref <30)

## 2016-07-21 NOTE — Telephone Encounter (Signed)
Test result discussed with patient. Her glucose is in the 400 range. I advise she starts taking her metformin as prescribed. Keep self well hydrated. Go to the ED if symptomatic. Start home glucose monitoring.  She is currently asymptomatic. She verbalized understanding of the instruction given.

## 2016-07-23 ENCOUNTER — Telehealth: Payer: Self-pay | Admitting: Family Medicine

## 2016-07-23 LAB — CYTOLOGY - PAP

## 2016-07-23 NOTE — Telephone Encounter (Signed)
Pt is in meetings at work today. Please leave her a  Voicemail about her question.

## 2016-07-23 NOTE — Telephone Encounter (Signed)
Her blood sugar numbers have come down to 260 this morning.  Pt is trying to change her eating habits.  Is this going down enough?  Please advise

## 2016-07-23 NOTE — Telephone Encounter (Signed)
Please advise her that her number is going in the right direction. Advise to keep documenting her CBG check for the next 1-2 week and then come back and see me. If persistently >200 please call me.   I will like for her to be around 130-180 for a start. Please schedule follow up with me in the next 1-2 weeks.

## 2016-07-24 ENCOUNTER — Telehealth: Payer: Self-pay | Admitting: Family Medicine

## 2016-07-24 NOTE — Telephone Encounter (Signed)
Message delivered. appt scheduled.

## 2016-07-24 NOTE — Telephone Encounter (Signed)
Message left to call back.   When she calls give her the following message.  1. PAP is normal. Repeat in 3-5 yrs.  2. Please advise her that her glucose number is going in the right direction. Advise to keep documenting her CBG check for the next 1-2 week and then come back and see me. If persistently >200 please call me sooner.   I will like for her to be around 130-180 for a start. Please schedule follow up with me in the next 1-2 weeks

## 2016-08-10 ENCOUNTER — Ambulatory Visit (INDEPENDENT_AMBULATORY_CARE_PROVIDER_SITE_OTHER): Payer: Managed Care, Other (non HMO) | Admitting: Family Medicine

## 2016-08-10 ENCOUNTER — Encounter: Payer: Self-pay | Admitting: Family Medicine

## 2016-08-10 VITALS — BP 112/72 | HR 84 | Temp 97.6°F | Ht 64.0 in | Wt 172.0 lb

## 2016-08-10 DIAGNOSIS — E119 Type 2 diabetes mellitus without complications: Secondary | ICD-10-CM

## 2016-08-10 DIAGNOSIS — E118 Type 2 diabetes mellitus with unspecified complications: Secondary | ICD-10-CM | POA: Diagnosis not present

## 2016-08-10 LAB — GLUCOSE, CAPILLARY: Glucose-Capillary: 194 mg/dL — ABNORMAL HIGH (ref 65–99)

## 2016-08-10 NOTE — Progress Notes (Signed)
Subjective:     Patient ID: Danielle Mccann, female   DOB: 23-Feb-1961, 55 y.o.   MRN: [id number]  Diabetes  She presents for her follow-up diabetic visit. She has type 2 diabetes mellitus. No MedicAlert identification noted. The initial diagnosis of diabetes was made 3 weeks ago. Her disease course has been stable. There are no hypoglycemic associated symptoms. Pertinent negatives for diabetes include no blurred vision, no chest pain, no fatigue, no polydipsia, no polyphagia, no polyuria and no visual change. Symptoms are stable. There are no diabetic complications. Risk factors for coronary artery disease include diabetes mellitus and obesity. Current diabetic treatment includes oral agent (monotherapy) (On Metformin 500 mg BID). She is compliant with treatment all of the time. Her weight is stable. She is following a generally healthy diet. When asked about meal planning, she reported none. She has not had a previous visit with a dietitian (She has appointment scheduled with the nutritionist end of the month.). Her home blood glucose trend is decreasing steadily. Her breakfast blood glucose is taken between 7-8 am. Her breakfast blood glucose range is generally 110-130 mg/dl. An ACE inhibitor/angiotensin II receptor blocker is not being taken.   Current Outpatient Prescriptions on File Prior to Visit  Medication Sig Dispense Refill  . Blood Glucose Calibration (FREESTYLE CONTROL SOLUTION) LIQD Use to check glucose once daily 1 Bottle 5  . glucose blood (FREESTYLE INSULINX TEST) test strip Use to check glucose once daily 100 each 12  . glucose monitoring kit (FREESTYLE) monitoring kit Use to check glucose once daily 1 each 0  . Lancets (FREESTYLE) lancets Use to check glucose once daily 100 each 12  . metFORMIN (GLUCOPHAGE) 500 MG tablet Take 1 tablet (500 mg total) by mouth 2 (two) times daily with a meal. 180 tablet 1   No current facility-administered medications on file prior to visit.     Past Medical History:  Diagnosis Date  . Smoking 1/2 pack a day or less      Review of Systems  Constitutional: Negative for fatigue.  Eyes: Negative for blurred vision.  Respiratory: Negative.   Cardiovascular: Negative.  Negative for chest pain.  Gastrointestinal: Negative.   Endocrine: Negative for polydipsia, polyphagia and polyuria.  Genitourinary: Negative.   Neurological: Negative.   All other systems reviewed and are negative.      Objective:   Physical Exam  Constitutional: She is oriented to person, place, and time. She appears well-developed. No distress.  Cardiovascular: Normal rate, regular rhythm and normal heart sounds.   No murmur heard. Pulmonary/Chest: Effort normal and breath sounds normal. No respiratory distress. She has no wheezes.  Abdominal: Soft. Bowel sounds are normal. She exhibits no distension and no mass. There is no tenderness.  Musculoskeletal: Normal range of motion. She exhibits no edema.  Sensory exam of the foot is normal, tested with the monofilament. Good pulses, no lesions or ulcers, good peripheral pulses.   Neurological: She is alert and oriented to person, place, and time.       Assessment:     DM2    Plan:     Check problem list

## 2016-08-10 NOTE — Patient Instructions (Signed)
Diabetes Mellitus and Food It is important for you to manage your blood sugar (glucose) level. Your blood glucose level can be greatly affected by what you eat. Eating healthier foods in the appropriate amounts throughout the day at about the same time each day will help you control your blood glucose level. It can also help slow or prevent worsening of your diabetes mellitus. Healthy eating may even help you improve the level of your blood pressure and reach or maintain a healthy weight.  General recommendations for healthful eating and cooking habits include:  Eating meals and snacks regularly. Avoid going long periods of time without eating to lose weight.  Eating a diet that consists mainly of plant-based foods, such as fruits, vegetables, nuts, legumes, and whole grains.  Using low-heat cooking methods, such as baking, instead of high-heat cooking methods, such as deep frying. Work with your dietitian to make sure you understand how to use the Nutrition Facts information on food labels. HOW CAN FOOD AFFECT ME? Carbohydrates Carbohydrates affect your blood glucose level more than any other type of food. Your dietitian will help you determine how many carbohydrates to eat at each meal and teach you how to count carbohydrates. Counting carbohydrates is important to keep your blood glucose at a healthy level, especially if you are using insulin or taking certain medicines for diabetes mellitus. Alcohol Alcohol can cause sudden decreases in blood glucose (hypoglycemia), especially if you use insulin or take certain medicines for diabetes mellitus. Hypoglycemia can be a life-threatening condition. Symptoms of hypoglycemia (sleepiness, dizziness, and disorientation) are similar to symptoms of having too much alcohol.  If your health care provider has given you approval to drink alcohol, do so in moderation and use the following guidelines:  Women should not have more than one drink per day, and men  should not have more than two drinks per day. One drink is equal to:  12 oz of beer.  5 oz of wine.  1 oz of hard liquor.  Do not drink on an empty stomach.  Keep yourself hydrated. Have water, diet soda, or unsweetened iced tea.  Regular soda, juice, and other mixers might contain a lot of carbohydrates and should be counted. WHAT FOODS ARE NOT RECOMMENDED? As you make food choices, it is important to remember that all foods are not the same. Some foods have fewer nutrients per serving than other foods, even though they might have the same number of calories or carbohydrates. It is difficult to get your body what it needs when you eat foods with fewer nutrients. Examples of foods that you should avoid that are high in calories and carbohydrates but low in nutrients include:  Trans fats (most processed foods list trans fats on the Nutrition Facts label).  Regular soda.  Juice.  Candy.  Sweets, such as cake, pie, doughnuts, and cookies.  Fried foods. WHAT FOODS CAN I EAT? Eat nutrient-rich foods, which will nourish your body and keep you healthy. The food you should eat also will depend on several factors, including:  The calories you need.  The medicines you take.  Your weight.  Your blood glucose level.  Your blood pressure level.  Your cholesterol level. You should eat a variety of foods, including:  Protein.  Lean cuts of meat.  Proteins low in saturated fats, such as fish, egg whites, and beans. Avoid processed meats.  Fruits and vegetables.  Fruits and vegetables that may help control blood glucose levels, such as apples, mangoes, and   yams.  Dairy products.  Choose fat-free or low-fat dairy products, such as milk, yogurt, and cheese.  Grains, bread, pasta, and rice.  Choose whole grain products, such as multigrain bread, whole oats, and brown rice. These foods may help control blood pressure.  Fats.  Foods containing healthful fats, such as nuts,  avocado, olive oil, canola oil, and fish. DOES EVERYONE WITH DIABETES MELLITUS HAVE THE SAME MEAL PLAN? Because every person with diabetes mellitus is different, there is not one meal plan that works for everyone. It is very important that you meet with a dietitian who will help you create a meal plan that is just right for you.   This information is not intended to replace advice given to you by your health care provider. Make sure you discuss any questions you have with your health care provider.   Document Released: 07/19/2005 Document Revised: 11/12/2014 Document Reviewed: 09/18/2013 Elsevier Interactive Patient Education 2016 [employer]  

## 2016-08-10 NOTE — Assessment & Plan Note (Signed)
Patient here for follow up. She is adjusting well to her new lifestyle. Working hard on diet and exercise. She has an up coming appointment with Dr. Brayton Caves for dietary counseling. She is really looking forward to it. CBG today looks fine. Will continue current dose of Metformin for now. I recommended eye exam. She stated she had seen Dr. Idolina Primer in the past. Referral placed and she will also attempt to call for an appointment. I discussed kidney protection with ACEi, however her BP is always low normal. Last urine microalbumin check was normal. I will monitor for now aiming for good glucose control. Foot exam completed and documented today.

## 2016-09-03 ENCOUNTER — Ambulatory Visit: Payer: Managed Care, Other (non HMO) | Admitting: Family Medicine

## 2016-09-04 ENCOUNTER — Ambulatory Visit (INDEPENDENT_AMBULATORY_CARE_PROVIDER_SITE_OTHER): Payer: Managed Care, Other (non HMO) | Admitting: Family Medicine

## 2016-09-04 ENCOUNTER — Encounter: Payer: Self-pay | Admitting: Family Medicine

## 2016-09-04 DIAGNOSIS — E119 Type 2 diabetes mellitus without complications: Secondary | ICD-10-CM | POA: Diagnosis not present

## 2016-09-04 NOTE — Patient Instructions (Signed)
  Diet Recommendations for Diabetes   Starchy (carb) foods: Bread, rice, pasta, potatoes, corn, cereal, grits, crackers, bagels, muffins, all baked goods.  (Fruits, milk, and yogurt also have carbohydrate, but most of these foods will not spike your blood sugar as the starchy foods will.)  A few fruits do cause high blood sugars; use small portions of bananas (limit to 1/2 at a time), grapes, watermelon, oranges, and most tropical fruits.    A low-glycemic food means a food that will not spike your blood glucose.  High-glycemic is the opposite.    Protein foods: Meat, fish, poultry, eggs, dairy foods, and beans such as pinto and kidney beans (beans also provide carbohydrate).   1. Eat at least 3 meals and 1-2 snacks per day. Never go more than 4-5 hours while awake without eating. Eat breakfast within the first hour of getting up.   2. Limit starchy foods to TWO per meal and ONE per snack. ONE portion of a starchy  food is equal to the following:   - ONE slice of bread (or its equivalent, such as half of a hamburger bun).   - 1/2 cup of a "scoopable" starchy food such as potatoes or rice.   - 15 grams of TOTAL carbohydrate as shown on food label.  3. Include at every meal: a protein food, a carb food, and vegetables and/or fruit.   - Obtain twice the volume of veg's as protein or carbohydrate foods for both lunch and dinner.   - Fresh or frozen veg's are best.   - Keep frozen veg's on hand for a quick vegetable serving.      4. Physical Activity GOAL:  At least 30 min 3 X wk.    Snack ideas:  Crackers or bread, but in a moderate amount (1 carb portion) and with a source of protein (peanut butter or  Cheese0; 1-2 tbsp of unsalted nuts or seeds along with some yogurt and/or fruit.   Yogurt:  Choose those that are NOT made for kids.  The best choice will be to mix some unsweetened (plain) yogurt with sweetened yogurt.  In choosing your sweetened yogurt, try to get the one with the least sugar.     Also:  Google how to make cauliflower mashed potatoes.  (I have made this with just adding goat cheese [chevre] to the caulifower.) Roasted vegetables are usually very different tasting from boiled or steamed.

## 2016-09-04 NOTE — Progress Notes (Signed)
Medical Nutrition Therapy:  Appt start time: 1500 end time:  1600.  Assessment:  Primary concerns today: Weight management and Blood sugar control.  Kennyth Lose would like some information on portion size and how to eat better for her BG control.  She is NOT interested in losing weight.  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    Learning Readiness: Ready  Usual eating pattern includes 3 meals (started after dx of DM) and 0 snacks per day. Frequent foods and beverages include water, chx, pork chops, salmon patties, brown rice, veg's.  Avoided foods include most baked fish (dislikes).   Usual physical activity includes none currently.  Was doing 30 min to the gym 3 X wk, but not for past 3 wks.    Most recent FBG have been in low 100s.  She has been using a supplement from [employer] (could not remember name of specific product), recommended by a friend of hers, which she takes with each meal.      24-hr recall: (Up at 7:30 AM; FBG was 104) B (8:30 AM)-   1 c dry oatmeal, 2 tsp sugar, 2 slc toast, 1/2 tsp butter, 1/2 tsp honey, water Snk ( AM)-   water L (2 PM)-  2 c baked beans, 3 hotdogs, water Snk (2:30)-  7 Strawberry Fig Newtons, water D (9:30 PM)-  1 c chx (legs) & rice, water Snk (10 PM)-  2 Fig Newtons, water Typical day? Yes.  Except dinner is usually before 8 PM, and Jackie did not like the meal, so ate less than usual.    Progress Towards Goal(s):  In progress.   Nutritional Diagnosis:  NI-5.8.2 Excessive carbohydrate intake As related to both meal and snack choices.  As evidenced by usual intake of up to 5 or 6 starch portions per meal.    Intervention:  Nutrition education.   Handouts given during visit include:  AVS  Demonstrated degree of understanding via:  Teach Back; discussed how to plan dinner meal to conform to recommendations.  Barriers to learning/adherence to lifestyle change:  Longstanding poor dietary habits.   Monitoring/Evaluation:  Dietary intake, exercise, FBG, and body weight in 6 week(s).

## 2016-10-12 ENCOUNTER — Ambulatory Visit (INDEPENDENT_AMBULATORY_CARE_PROVIDER_SITE_OTHER): Payer: Managed Care, Other (non HMO) | Admitting: Family Medicine

## 2016-10-12 ENCOUNTER — Ambulatory Visit: Payer: Managed Care, Other (non HMO) | Admitting: Family Medicine

## 2016-10-12 ENCOUNTER — Other Ambulatory Visit: Payer: Self-pay | Admitting: Family Medicine

## 2016-10-12 ENCOUNTER — Encounter: Payer: Self-pay | Admitting: Family Medicine

## 2016-10-12 VITALS — BP 102/60 | HR 60 | Temp 98.0°F | Wt 171.0 lb

## 2016-10-12 DIAGNOSIS — E119 Type 2 diabetes mellitus without complications: Secondary | ICD-10-CM

## 2016-10-12 DIAGNOSIS — E785 Hyperlipidemia, unspecified: Secondary | ICD-10-CM | POA: Diagnosis not present

## 2016-10-12 LAB — POCT GLYCOSYLATED HEMOGLOBIN (HGB A1C): Hemoglobin A1C: 5.5

## 2016-10-12 NOTE — Assessment & Plan Note (Signed)
A1C improved to 5.5. Patient so excited. She will continue same dose of Metformin in addition to diet and exercise. Still don't have her eye exam report. She signed ROI today to obtain document from her eye doctor. F/U in 3 months.

## 2016-10-12 NOTE — Progress Notes (Signed)
Subjective:     Patient ID: Danielle Mccann, female   DOB: 18-Jan-1961, 55 y.o.   MRN: [id number]  HPI DM2: Here for follow up. She is compliant with her medication and diet, not so much with exercise given her work schedule. Denies any concern today.  Dyslipidemia: Her last FLP was revisited again given that she now has a DM diagnosis. No other concern today.  Current Outpatient Prescriptions on File Prior to Visit  Medication Sig Dispense Refill  . metFORMIN (GLUCOPHAGE) 500 MG tablet Take 1 tablet (500 mg total) by mouth 2 (two) times daily with a meal. 180 tablet 1  . Blood Glucose Calibration (FREESTYLE CONTROL SOLUTION) LIQD Use to check glucose once daily 1 Bottle 5  . glucose blood (FREESTYLE INSULINX TEST) test strip Use to check glucose once daily 100 each 12  . glucose monitoring kit (FREESTYLE) monitoring kit Use to check glucose once daily 1 each 0  . Lancets (FREESTYLE) lancets Use to check glucose once daily 100 each 12   No current facility-administered medications on file prior to visit.    Past Medical History:  Diagnosis Date  . Smoking 1/2 pack a day or less   . Smoking 1/2 pack a day or less      Review of Systems  Constitutional: Negative.   Respiratory: Negative.   Cardiovascular: Negative.   Gastrointestinal: Negative.   Genitourinary: Negative.   All other systems reviewed and are negative.      Vitals:   10/12/16 0838  BP: 102/60  Pulse: 60  Temp: 98 F (36.7 C)  TempSrc: Oral  SpO2: 97%  Weight: 171 lb (77.6 kg)    Objective:   Physical Exam  Constitutional: She appears well-developed. No distress.  Cardiovascular: Normal rate, regular rhythm and normal heart sounds.   No murmur heard. Pulmonary/Chest: Effort normal and breath sounds normal. No respiratory distress. She has no wheezes.  Abdominal: Soft. Bowel sounds are normal. She exhibits no distension and no mass. There is no tenderness.  Musculoskeletal: Normal range of motion.  She exhibits no edema.  Nursing note and vitals reviewed.      Assessment:     DM2 Dyslipidemia    Plan:   A1C improved to 5.5. Patient so excited. She will continue same dose of Metformin in addition to diet and exercise. Still don't have her eye exam report. She signed ROI today to obtain document from her eye doctor. F/U in 3 months.  I again reviewed and discussed her FLP result with her. Her LDL is goal with slightly low HDL. Given diagnosis of DM she might benefit from statin. Patient prefers only diet and exercise control for now. Repeat FLP next year.

## 2016-10-12 NOTE — Patient Instructions (Signed)
It was nice seeing you today. I am happy your A1C came down fine. Continue current dose of Metformin for now and continue home CBG monitoring. I will see you in 3 months.

## 2016-10-12 NOTE — Assessment & Plan Note (Signed)
I again reviewed and discussed her FLP result with her. Her LDL is goal with slightly low HDL. Given diagnosis of DM she might benefit from statin. Patient prefers only diet and exercise control for now. Repeat FLP next year.

## 2016-10-19 ENCOUNTER — Ambulatory Visit (INDEPENDENT_AMBULATORY_CARE_PROVIDER_SITE_OTHER): Payer: Managed Care, Other (non HMO) | Admitting: Family Medicine

## 2016-10-19 ENCOUNTER — Encounter: Payer: Self-pay | Admitting: Family Medicine

## 2016-10-19 DIAGNOSIS — E119 Type 2 diabetes mellitus without complications: Secondary | ICD-10-CM | POA: Diagnosis not present

## 2016-10-19 NOTE — Progress Notes (Signed)
Medical Nutrition Therapy:  Appt start time: 1500 end time:  1600.  Assessment:  Primary concerns today: Weight management and Blood sugar control.  Kennyth Lose gas been going to the gym 3 X wk, which has been a struggle.  She usually comes home to complete her notes for work, eats dinner, then goes to the gym.  She has been going with her daughter, who is home from college, but had done well establishing this routine even before her daughter was home.   A1C on Dec 8 was 5.5.  Dr. Gwendlyn Deutscher would like to wait for another A1C check in 3 months before discontinuing metformin.  FBG have been running 98-139, with most 100-110.  24-hr recall:  (Up at 7:30 AM) B (9 AM)-  1 sausage&egg McMuffin w/ chs, water Snk ( AM)-  --- L (2 PM)-  Hungry Jack frzn meal (pork, veg's), water Snk ( PM)-  --- D (9:30 PM)-  4 oz [place], 1/4 c gravy, 1/2 c drsng, 1 c mixed veg's, 1/2 c mashed pot's, water Snk ( PM)-  --- Typical day? Yes.  except lunch and dinner were later than usual, although lunch time is variable b/c of work.  Bkfast is usually at home, such as eggs or oatmeal.    Progress Towards Goal(s):  In progress.   Nutritional Diagnosis:  Some progress noted on NI-5.8.2 Excessive carbohydrate intake As related to both meal and snack choices.  As evidenced by reduced number of starch portions per meal.    Intervention:  Nutrition education.   Handouts given during visit include:  AVS  Demonstrated degree of understanding via:  Teach Back; discussed how to plan dinner meal to conform to recommendations.  Barriers to learning/adherence to lifestyle change: Longstanding poor dietary habits.   Monitoring/Evaluation:  Dietary intake, exercise, FBG, and body weight in 8 week(s).  No appts available sooner.

## 2016-10-19 NOTE — Patient Instructions (Addendum)
-   Snack suggestions:  Fresh fruit, fruit cup, apple sauce, yogurt, string cheese, peanut butter crackers, nuts/seeds, 1/2 sandwich, high-fiber cereal with milk.  - Consider this whole process of dietary changes to be a process of changing your taste preferences.  Changing taste preferences requires consistency in practice.    - For example, KNOW how many Stevia packs you use in your oatmeal.  Cut back by one pack, then cut back by one more after a couples weeks.    - ADEQUATE SLEEP is essential to effective weight management.    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  Listen!  - Look for low-carb bread for an easy way to cut back on carb's.    Goals:  1. Eat at least 3 meals and 1-2 snacks per day. Never go more than 4-5 hours while awake without eating. Eat breakfast within the first hour of getting up.   2. Limit starchy foods to TWO per meal and ONE per snack.   3. Obtain vegetables at both lunch and dinner.   - Complete your Goals Sheet, and bring to follow-up in February.    Diet Recommendations for Diabetes  Starchy (carb) foods: Bread, rice, pasta, potatoes, corn, cereal, grits, crackers, bagels, muffins, all baked goods.  (Fruits, milk, and yogurt also have carbohydrate, but most of these foods will not spike your blood sugar as most starchy foods will.)  A few fruits do cause high blood sugars; use small portions of bananas (limit to 1/2 at a time), grapes, watermelon, oranges, and most tropical fruits.    Protein foods: Meat, fish, poultry, eggs, dairy foods, and beans such as pinto and kidney beans (beans also provide carbohydrate).    Eat at least 3 meals and 1-2 snacks per day. Never go more than 4-5 hours while awake without eating. Eat breakfast within the first hour of getting up.    Limit starchy foods to TWO per meal and ONE per snack. ONE portion of a starchy  food is equal to the following:   - ONE slice of bread (or its equivalent, such  as half of a hamburger bun).   - 1/2 cup of a "scoopable" starchy food such as potatoes or rice.   - 15 grams of Total Carbohydrate as shown on food label.    Include at every meal: a protein food, a carb food, and vegetables and/or fruit.   - Obtain twice the volume of veg's as protein or carbohydrate foods for both lunch and dinner.   - Fresh or frozen veg's are best.   - Keep frozen veg's on hand for a quick vegetable serving.

## 2016-10-22 ENCOUNTER — Ambulatory Visit: Payer: Managed Care, Other (non HMO) | Admitting: Family Medicine

## 2016-12-06 ENCOUNTER — Ambulatory Visit: Payer: Managed Care, Other (non HMO) | Admitting: Family Medicine

## 2017-01-04 ENCOUNTER — Ambulatory Visit (INDEPENDENT_AMBULATORY_CARE_PROVIDER_SITE_OTHER): Payer: Managed Care, Other (non HMO) | Admitting: Family Medicine

## 2017-01-04 ENCOUNTER — Encounter: Payer: Self-pay | Admitting: Family Medicine

## 2017-01-04 VITALS — BP 124/64 | HR 79 | Temp 98.7°F | Ht 64.0 in | Wt 168.0 lb

## 2017-01-04 DIAGNOSIS — E119 Type 2 diabetes mellitus without complications: Secondary | ICD-10-CM | POA: Diagnosis not present

## 2017-01-04 DIAGNOSIS — F411 Generalized anxiety disorder: Secondary | ICD-10-CM | POA: Diagnosis not present

## 2017-01-04 LAB — POCT GLYCOSYLATED HEMOGLOBIN (HGB A1C): Hemoglobin A1C: 5.4

## 2017-01-04 MED ORDER — SERTRALINE HCL 50 MG PO TABS: 50.0000 mg | ORAL_TABLET | Freq: Every day | ORAL | 1 refills | Status: DC

## 2017-01-04 MED ORDER — METFORMIN HCL ER 500 MG PO TB24: 500.0000 mg | ORAL_TABLET | Freq: Every day | ORAL | 1 refills | Status: DC

## 2017-01-04 NOTE — Assessment & Plan Note (Signed)
She is overly worried about the safety of her daughter. Her GAD-7 score is 7. I recommended counseling but she prefer starting medication. Zoloft prescribed. She is advised to also schedule appointment with her therapist for CBT. F/U in 4 weeks for reassessment. She decline talking with our [facility] today when offered.

## 2017-01-04 NOTE — Progress Notes (Signed)
   01/04/17 0857  GAD-7 Over the last 2 weeks, how often have you been bothered by the following problems?  Feeling Nervous, Anxious, or on Edge 1  Not Being Able to Stop or Control Worrying 1  Worrying Too Much About Different Things 0  Trouble Relaxing 1  Being So Restless it's Hard To Sit Still 0  Becoming Easily Annoyed or Irritable 1  Feeling Afraid As If Something Awful Might Happen 3  Total GAD-7 Score 7  Anxiety Difficulty  Difficulty At Work, Home, or Getting  Along With Others? Extremely difficult

## 2017-01-04 NOTE — Progress Notes (Signed)
Subjective:     Patient ID: Danielle Mccann, female   DOB: 1961/04/20, 56 y.o.   MRN: [id number]  Anxiety  Presents for initial visit. Onset was 1 to 6 months ago. The problem has been waxing and waning. Symptoms include excessive worry, irritability and nervous/anxious behavior. Patient reports no chest pain, compulsions, confusion, decreased concentration, depressed mood, dizziness, insomnia, muscle tension, obsessions, panic, restlessness, shortness of breath or suicidal ideas. Symptoms occur occasionally (Occurs mostly when she is worried about her daughter. She wishes she can put her in a ball to protect her from the world.). Duration: Few mins. The severity of symptoms is mild. Exacerbated by: Worry about her children. The quality of sleep is good. Nighttime awakenings: none.   There are no known risk factors. Her past medical history is significant for anxiety/panic attacks. There is no history of suicide attempts. Past treatments include nothing (She will like to get on a medication. She was told Zoloft is a good one. She already has a therapist whom she will be seeing for counseling).   DM2: Compliant with Metformin 500 mg BID. Her diet is ok. CBG this morning was 137 otherwise has been very good.  Current Outpatient Prescriptions on File Prior to Visit  Medication Sig Dispense Refill  . metFORMIN (GLUCOPHAGE) 500 MG tablet Take 1 tablet (500 mg total) by mouth 2 (two) times daily with a meal. 180 tablet 1  . Blood Glucose Calibration (FREESTYLE CONTROL SOLUTION) LIQD Use to check glucose once daily 1 Bottle 5  . Blood Glucose Monitoring Suppl (FREESTYLE FREEDOM LITE) w/Device KIT USE TO CHECK GLUCOSE ONCE DAILY  0  . glucose blood (FREESTYLE INSULINX TEST) test strip Use to check glucose once daily 100 each 12  . glucose monitoring kit (FREESTYLE) monitoring kit Use to check glucose once daily 1 each 0  . Lancets (FREESTYLE) lancets Use to check glucose once daily 100 each 12   No  current facility-administered medications on file prior to visit.    Past Medical History:  Diagnosis Date  . Smoking 1/2 pack a day or less   . Smoking 1/2 pack a day or less    Vitals:   01/04/17 0854  BP: 124/64  Pulse: 79  Temp: 98.7 F (37.1 C)  TempSrc: Oral  SpO2: 98%  Weight: 168 lb (76.2 kg)  Height: '5\' 4"'  (1.626 m)     Review of Systems  Constitutional: Positive for irritability.  Respiratory: Negative.  Negative for shortness of breath.   Cardiovascular: Negative.  Negative for chest pain.  Gastrointestinal: Negative.   Genitourinary: Negative.   Neurological: Negative for dizziness.  Psychiatric/Behavioral: Negative for confusion, decreased concentration and suicidal ideas. The patient is nervous/anxious. The patient does not have insomnia.   All other systems reviewed and are negative.      Objective:   Physical Exam  Constitutional: She is oriented to person, place, and time. She appears well-developed. No distress.  Cardiovascular: Normal rate, regular rhythm and normal heart sounds.   No murmur heard. Pulmonary/Chest: Effort normal and breath sounds normal. No respiratory distress. She has no wheezes.  Abdominal: Soft. Bowel sounds are normal. She exhibits no mass. There is no tenderness.  Musculoskeletal: Normal range of motion.  Neurological: She is alert and oriented to person, place, and time.  Psychiatric: She has a normal mood and affect. Her speech is normal and behavior is normal. Judgment and thought content normal. Her mood appears not anxious. Cognition and memory are normal.  Nursing  note and vitals reviewed.      Assessment:     Anxiety DM2: Well controlled    Plan:     Check problem list

## 2017-01-04 NOTE — Assessment & Plan Note (Signed)
Well controlled. A1C checked today was 5.4, slight improvement from the last. She asked if she can get off med. I recommended switching to Metformin 500 mg Qd of the extended release tab. She agreed with plan. Medication escribed. Diet and exercise counseling done briefly for weight loss. F/U in 3 months.

## 2017-01-04 NOTE — Patient Instructions (Signed)
It was nice seeing you again today. Your A1C looks beautiful. We can switch you to Metformin 500 mg daily extended release. We will recheck A1C in 3 months. Please cal your therapy for anxiety counseling. That will help a lot in addition to medication. Please start Zoloft. F/U in 4 weeks.

## 2017-01-11 ENCOUNTER — Ambulatory Visit: Payer: Managed Care, Other (non HMO) | Admitting: Family Medicine

## 2017-01-13 ENCOUNTER — Other Ambulatory Visit: Payer: Self-pay | Admitting: Family Medicine

## 2017-03-13 ENCOUNTER — Other Ambulatory Visit: Payer: Self-pay | Admitting: Family Medicine

## 2017-03-13 DIAGNOSIS — Z1231 Encounter for screening mammogram for malignant neoplasm of breast: Secondary | ICD-10-CM

## 2017-03-15 ENCOUNTER — Ambulatory Visit
Admission: RE | Admit: 2017-03-15 | Discharge: 2017-03-15 | Disposition: A | Discharge status: Home / Self Care | Payer: Managed Care, Other (non HMO) | Source: Ambulatory Visit | Attending: Family Medicine | Admitting: Family Medicine

## 2017-03-15 DIAGNOSIS — Z1231 Encounter for screening mammogram for malignant neoplasm of breast: Secondary | ICD-10-CM

## 2017-04-02 ENCOUNTER — Encounter: Payer: Managed Care, Other (non HMO) | Admitting: Family Medicine

## 2017-05-10 ENCOUNTER — Encounter: Payer: Self-pay | Admitting: Family Medicine

## 2017-05-10 ENCOUNTER — Ambulatory Visit (INDEPENDENT_AMBULATORY_CARE_PROVIDER_SITE_OTHER): Payer: Managed Care, Other (non HMO) | Admitting: Family Medicine

## 2017-05-10 VITALS — BP 116/62 | HR 75 | Temp 98.2°F | Ht 64.0 in | Wt 169.8 lb

## 2017-05-10 DIAGNOSIS — Z0000 Encounter for general adult medical examination without abnormal findings: Secondary | ICD-10-CM

## 2017-05-10 DIAGNOSIS — E118 Type 2 diabetes mellitus with unspecified complications: Secondary | ICD-10-CM | POA: Diagnosis not present

## 2017-05-10 LAB — POCT GLYCOSYLATED HEMOGLOBIN (HGB A1C): Hemoglobin A1C: 5.9

## 2017-05-10 NOTE — Progress Notes (Signed)
Subjective:     Danielle Mccann is a 56 y.o. female and is here for a comprehensive physical exam. The patient reports problems - mole on the neck.  Social History   Social History  . Marital status: Single    Spouse name: N/A  . Number of children: N/A  . Years of education: N/A   Occupational History  . Not on file.   Social History Main Topics  . Smoking status: Former Smoker    Packs/day: 0.50    Types: Cigarettes    Quit date: 08/22/2015  . Smokeless tobacco: Never Used  . Alcohol use No  . Drug use: No  . Sexual activity: Not Currently     Comment: quit 11 years ago   Other Topics Concern  . Not on file   Social History Narrative   Lives with teenage children Ages 35 & 3.  Originally from [place]- brother ans sister here.  Works at [employer] as a Engineer, manufacturing systems.     Health Maintenance  Topic Date Due  . OPHTHALMOLOGY EXAM  04/12/1971  . INFLUENZA VACCINE  06/05/2017  . HEMOGLOBIN A1C  07/07/2017  . URINE MICROALBUMIN  07/20/2017  . FOOT EXAM  08/10/2017  . PNEUMOCOCCAL POLYSACCHARIDE VACCINE (2) 05/26/2018  . MAMMOGRAM  03/16/2019  . PAP SMEAR  07/21/2019  . COLONOSCOPY  07/23/2023  . TETANUS/TDAP  11/07/2025  . Hepatitis C Screening  Completed  . HIV Screening  Completed    The following portions of the patient's history were reviewed and updated as appropriate: allergies, current medications, past family history, past medical history, past social history, past surgical history and problem list.  Review of Systems Pertinent items noted in HPI and remainder of comprehensive ROS otherwise negative.   Objective:    BP 116/62   Pulse 75   Temp 98.2 F (36.8 C) (Oral)   Ht 5\' 4"  (1.626 m)   Wt 169 lb 12.8 oz (77 kg)   SpO2 97%   BMI 29.15 kg/m  General appearance: alert, cooperative and appears stated age Head: Normocephalic, without obvious abnormality, atraumatic Eyes: conjunctivae/corneas clear. PERRL, EOM's  intact. Fundi benign. Ears: normal TM's and external ear canals both ears Throat: lips, mucosa, and tongue normal; teeth and gums normal Neck: no adenopathy, no carotid bruit, no JVD, supple, symmetrical, trachea midline and thyroid not enlarged, symmetric, no tenderness/mass/nodules Lungs: clear to auscultation bilaterally Heart: regular rate and rhythm, S1, S2 normal, no murmur, click, rub or gallop Abdomen: soft, non-tender; bowel sounds normal; no masses,  no organomegaly Pelvic: deferred Extremities: extremities normal, atraumatic, no cyanosis or edema Skin: Skin color, texture, turgor normal. No rashes or lesions. Small,<1 mm mole on the right side of her neck with multiple dark skin tags on her face and neck. Lymph nodes: No cervical lymphadenopathy Neurologic: Alert and oriented X 3, normal strength and tone. Normal symmetric reflexes. Normal coordination and gait    Assessment:    Healthy female exam.   Mole      Plan:   Exam benign today. Diet counseling given. A1C stable at 5.9, continue Metformin XL at 500 mg qd. Derm clinic appointment scheduled for mole removal. F/U in 1 yr for annual wellness exam and in 4 months for DM check.

## 2017-05-10 NOTE — Patient Instructions (Signed)
It was nice seeing you today. We have scheduled your dermatology appointment for Aug 15th. Please keep this in mind. Also your DM looks stable with an A1C of 5.9. Please continue the same dose of Metformin. I will see you back in 4 months.

## 2017-06-19 ENCOUNTER — Ambulatory Visit (INDEPENDENT_AMBULATORY_CARE_PROVIDER_SITE_OTHER): Payer: Managed Care, Other (non HMO) | Admitting: Family Medicine

## 2017-06-19 ENCOUNTER — Encounter: Payer: Self-pay | Admitting: Family Medicine

## 2017-06-19 VITALS — BP 118/64 | HR 73 | Temp 98.3°F | Ht 64.0 in | Wt 168.0 lb

## 2017-06-19 DIAGNOSIS — L918 Other hypertrophic disorders of the skin: Secondary | ICD-10-CM

## 2017-06-19 HISTORY — DX: Other hypertrophic disorders of the skin: L91.8

## 2017-06-19 NOTE — Progress Notes (Signed)
[facility] Progress Note  Subjective:  Danielle Mccann is a 56 y.o. with a history of multiple skin tags. She presents today for removal of a skin tag on her neck. This lesion causes her pain and annoyance when she tries to wear necklaces or wash her neck. It has recently become more hypopigmented, which the patient attributes to scratching at the area. It has not changed in shape recently. The patient endorses a family history of skin tags. She has had on skin tag removed from the corner of her mouth before with no complication or regrowth. ROS: No fever, no drainage, no rash  Chief Complaint  Patient presents with  . Nevus   No Known Allergies  Objective: Blood pressure 118/64, pulse 73, temperature 98.3 F (36.8 C), temperature source Oral, height 5\' 4"  (1.626 m), weight 168 lb (76.2 kg), SpO2 97 %. Constitutional: Well-appearing female in NAD HENT: MMM Cardiovascular: RRR, S1, S2, no m/r/g.  Pulmonary/Chest: No respiratory distress.  Neuro: no focal deficits. Skin: Skin is warm and dry. No rash noted. No erythema. Small  (72mm) hypopigmented keratinized skin tag on right lower neck, numerous hyperpigmented skin tags across cheeks and upper neck.    Vitals reviewed  Assessment/Plan: Procedure note: skin biopsy Written informed consent received. Area was cleaned with alcohol swabs x 2, then injected with a total of 1 cm 2% lidocaine with epinephrine.  Good anesthesia achieved.  Skin tag removed by scalpel.  Hemostasis achieved promptly with silver nitrate Area covered with antibiotic ointment and then bandaid. Patient was advised to keep area clean and dry and change dressing daily for the next few days to prevent infection.   Skin tag - Patient desired removal due to irritation. Completed as described above. - Gave return precautions.  Follow-up as needed.  Olene Floss, MD [facility], PGY-3

## 2017-06-19 NOTE — Patient Instructions (Signed)

## 2017-06-19 NOTE — Assessment & Plan Note (Signed)
-   Patient desired removal due to irritation. Completed as described above. - Gave return precautions.

## 2017-07-11 ENCOUNTER — Other Ambulatory Visit: Payer: Self-pay | Admitting: Family Medicine

## 2017-09-24 ENCOUNTER — Encounter: Payer: Self-pay | Admitting: Family Medicine

## 2017-09-24 ENCOUNTER — Ambulatory Visit (INDEPENDENT_AMBULATORY_CARE_PROVIDER_SITE_OTHER): Payer: Managed Care, Other (non HMO) | Admitting: Family Medicine

## 2017-09-24 ENCOUNTER — Other Ambulatory Visit: Payer: Self-pay

## 2017-09-24 VITALS — BP 134/62 | HR 73 | Temp 98.8°F | Ht 64.0 in | Wt 170.8 lb

## 2017-09-24 DIAGNOSIS — R200 Anesthesia of skin: Secondary | ICD-10-CM

## 2017-09-24 DIAGNOSIS — E119 Type 2 diabetes mellitus without complications: Secondary | ICD-10-CM

## 2017-09-24 DIAGNOSIS — R739 Hyperglycemia, unspecified: Secondary | ICD-10-CM | POA: Diagnosis not present

## 2017-09-24 DIAGNOSIS — R202 Paresthesia of skin: Secondary | ICD-10-CM

## 2017-09-24 HISTORY — DX: Paresthesia of skin: R20.2

## 2017-09-24 LAB — POCT GLYCOSYLATED HEMOGLOBIN (HGB A1C): Hemoglobin A1C: 5.9

## 2017-09-24 MED ORDER — FREESTYLE LANCETS MISC: 12 refills | Status: DC

## 2017-09-24 MED ORDER — GLUCOSE BLOOD VI STRP: ORAL_STRIP | 12 refills | Status: DC

## 2017-09-24 NOTE — Assessment & Plan Note (Signed)
A1C stable. Continue current Metformin dose of 500 mg qd XL. Urine microalbumin checked today. Normal foot exam. Bmet checked. She confirmed she got her eye exam done already and she will get [place] her record.

## 2017-09-24 NOTE — Addendum Note (Signed)
Addended by: Andrena Mews T on: 09/24/2017 09:43 AM   Modules accepted: Orders

## 2017-09-24 NOTE — Progress Notes (Signed)
Subjective:     Patient ID: Danielle Mccann, female   DOB: June 22, 1961, 56 y.o.   MRN: [id number]  HPI DM2: Here for follow-up. She is compliant with her meds.Her home CBG has been fine, it was 149 this morning. Right arm discomfort: Heaviness of her right arm x 1 month on and off.  No trigger. Occasionally she will have similar symptoms in her thigh. She is more concern about her right arm. Denies neck pain, no headache, no recent injury to her arm. HM: Need vaccination update.  Current Outpatient Medications on File Prior to Visit  Medication Sig Dispense Refill  . metFORMIN (GLUCOPHAGE-XR) 500 MG 24 hr tablet TAKE 1 TABLET BY MOUTH EVERY DAY WITH BREAKFAST 90 tablet 1  . Blood Glucose Calibration (FREESTYLE CONTROL SOLUTION) LIQD Use to check glucose once daily 1 Bottle 5  . Blood Glucose Monitoring Suppl (FREESTYLE FREEDOM LITE) w/Device KIT USE TO CHECK GLUCOSE ONCE DAILY  0  . glucose blood (FREESTYLE INSULINX TEST) test strip Use to check glucose once daily 100 each 12  . glucose monitoring kit (FREESTYLE) monitoring kit Use to check glucose once daily 1 each 0  . Lancets (FREESTYLE) lancets Use to check glucose once daily 100 each 12  . sertraline (ZOLOFT) 50 MG tablet Take 1 tablet (50 mg total) by mouth daily. (Patient not taking: Reported on 05/10/2017) 90 tablet 1   No current facility-administered medications on file prior to visit.    Past Medical History:  Diagnosis Date  . Smoking 1/2 pack a day or less   . Smoking 1/2 pack a day or less    Vitals:   09/24/17 0836  BP: 134/62  Pulse: 73  Temp: 98.8 F (37.1 C)  TempSrc: Oral  SpO2: 98%  Weight: 170 lb 12.8 oz (77.5 kg)  Height: _0  (1.626 m)    Review of Systems  Respiratory: Negative.   Cardiovascular: Negative.   Gastrointestinal: Negative.   Neurological: Negative.   All other systems reviewed and are negative.      Objective:   Physical Exam  Constitutional: She is oriented to person, place, and  time. She appears well-developed. No distress.  Cardiovascular: Normal rate, regular rhythm and normal heart sounds.  No murmur heard. Pulmonary/Chest: Effort normal. No respiratory distress. She has no wheezes.  Abdominal: Soft. Bowel sounds are normal. She exhibits no distension and no mass. There is no tenderness.  Neurological: She is alert and oriented to person, place, and time. She has normal strength and normal reflexes. No cranial nerve deficit or sensory deficit. Coordination normal.  Nursing note and vitals reviewed.      Assessment:     DM2: Right arm discomfort:    Plan:     Check problem list.  Flu shot offered but she declined.

## 2017-09-24 NOTE — Patient Instructions (Signed)
Paresthesia Paresthesia is a burning or prickling feeling. This feeling can happen in any part of the body. It often happens in the hands, arms, legs, or feet. Usually, it is not painful. In most cases, the feeling goes away in a short time and is not a sign of a serious problem. Follow these instructions at home:  Avoid drinking alcohol.  Try massage or needle therapy (acupuncture) to help with your problems.  Keep all follow-up visits as told by your doctor. This is important. Contact a doctor if:  You keep on having episodes of paresthesia.  Your burning or prickling feeling gets worse when you walk.  You have pain or cramps.  You feel dizzy.  You have a rash. Get help right away if:  You feel weak.  You have trouble walking or moving.  You have problems speaking, understanding, or seeing.  You feel confused.  You cannot control when you pee (urinate) or poop (bowel movement).  You lose feeling (numbness) after an injury.  You pass out (faint). This information is not intended to replace advice given to you by your health care provider. Make sure you discuss any questions you have with your health care provider. Document Released: 10/04/2008 Document Revised: 03/29/2016 Document Reviewed: 10/18/2014 Elsevier Interactive Patient Education  2018 Elsevier Inc.  

## 2017-09-24 NOTE — Assessment & Plan Note (Signed)
Normal neuro exam. Normal MSK exam. May be related to her DM? TSH checked. NCV/EMG ordered. Conservative measures for now pending test result. F/U soon if symptoms worsens.

## 2017-09-25 LAB — MICROALBUMIN / CREATININE URINE RATIO
CREATININE, UR: 61.2 mg/dL
Microalb/Creat Ratio: <4.9 mg/g creat (ref 0.0–30.0)
Microalbumin, Urine: <3 ug/mL

## 2017-09-25 LAB — BASIC METABOLIC PANEL
BUN / CREAT RATIO: 16 (ref 9–23)
BUN: 11 mg/dL (ref 6–24)
CO2: 24 mmol/L (ref 20–29)
CREATININE: 0.69 mg/dL (ref 0.57–1.00)
Calcium: 9 mg/dL (ref 8.7–10.2)
Chloride: 103 mmol/L (ref 96–106)
GFR calc Af Amer: 113 mL/min/{1.73_m2} (ref >59)
GFR, EST NON AFRICAN AMERICAN: 98 mL/min/{1.73_m2} (ref >59)
Glucose: 133 mg/dL — ABNORMAL HIGH (ref 65–99)
Potassium: 4.5 mmol/L (ref 3.5–5.2)
SODIUM: 141 mmol/L (ref 134–144)

## 2017-09-25 LAB — TSH: TSH: 1.5 u[IU]/mL (ref 0.450–4.500)

## 2017-09-30 ENCOUNTER — Telehealth: Payer: Self-pay | Admitting: *Deleted

## 2017-09-30 LAB — HM DIABETES EYE EXAM

## 2017-09-30 NOTE — Telephone Encounter (Signed)
Patient informed of normal results. Danielle Mccann,CMA

## 2017-09-30 NOTE — Telephone Encounter (Signed)
-----   Message from Kinnie Feil, MD sent at 09/28/2017  4:17 AM EST ----- Please call to inform patient that her labs looks good, including her thyroid and urine test. Thanks.

## 2017-10-03 ENCOUNTER — Encounter (INDEPENDENT_AMBULATORY_CARE_PROVIDER_SITE_OTHER): Payer: Managed Care, Other (non HMO) | Admitting: Diagnostic Neuroimaging

## 2017-10-03 ENCOUNTER — Ambulatory Visit (INDEPENDENT_AMBULATORY_CARE_PROVIDER_SITE_OTHER): Payer: Managed Care, Other (non HMO) | Admitting: Diagnostic Neuroimaging

## 2017-10-03 DIAGNOSIS — R202 Paresthesia of skin: Secondary | ICD-10-CM | POA: Diagnosis not present

## 2017-10-03 DIAGNOSIS — R200 Anesthesia of skin: Secondary | ICD-10-CM

## 2017-10-03 DIAGNOSIS — E119 Type 2 diabetes mellitus without complications: Secondary | ICD-10-CM

## 2017-10-03 DIAGNOSIS — Z0289 Encounter for other administrative examinations: Secondary | ICD-10-CM

## 2017-10-04 NOTE — Procedures (Signed)
[facility]  NCS (NERVE CONDUCTION STUDY) WITH EMG (ELECTROMYOGRAPHY) REPORT   STUDY DATE: 10/03/17 PATIENT NAME: Danielle Mccann DOB: 09/30/1961 MRN: [id number]  ORDERING CLINICIAN: Su Ley, MD  TECHNOLOGIST: Oneita Jolly ELECTROMYOGRAPHER: Earlean Polka. Valorie Mcgrory, MD  CLINICAL INFORMATION: 56 year old female with intermittent right upper arm weakness, 5-10 times per day, lasting minutes at a time, for past 1 month.  Patient denies neck pain.  Patient denies any numbness or tingling in her fingers or hand.   FINDINGS: NERVE CONDUCTION STUDY: Bilateral median motor responses have prolonged distal latencies, normal amplitudes, normal conduction velocities.  Right ulnar motor response and F wave latency are normal.  Bilateral median sensory responses are prolonged peak latencies, normal amplitudes.  Right radial and bilateral ulnar sensory responses are normal.   NEEDLE ELECTROMYOGRAPHY: Needle examination of right upper extremity deltoid, biceps, triceps, flexor carpi radialis, first dorsal interosseous and right cervical paraspinal muscles is normal.   IMPRESSION:   Abnormal study demonstrating: - Mild bilateral median neuropathies at the wrists consistent with mild bilateral carpal tunnel syndrome.  This is likely an incidental finding and does not correlate with patient's reported symptoms. - No electrodiagnostic explanation for patient's symptoms in this study.       INTERPRETING PHYSICIAN:  Penni Bombard, MD Certified in Neurology, Neurophysiology and Neuroimaging  [facility] [address] [place], [place] [address] [phone]   [facility]    Nerve / Sites Muscle Latency Ref. Amplitude Ref. Rel Amp Segments Distance Velocity Ref. Area    ms ms mV mV %  cm m/s m/s mVms  R Median - APB     Wrist APB 4.5 ?4.4 13.5 ?4.0 100 Wrist - APB 7   46.2     Upper arm APB 8.1  13.8  103 Upper arm - Wrist 21 58 ?49 48.8    L Median - APB     Wrist APB 4.7 ?4.4 11.6 ?4.0 100 Wrist - APB 7   44.6     Upper arm APB 8.7  11.2  96.6 Upper arm - Wrist 21 52 ?49 43.8  R Ulnar - ADM     Wrist ADM 3.1 ?3.3 8.9 ?6.0 100 Wrist - ADM 7   21.5     B.Elbow ADM 6.9  7.3  82 B.Elbow - Wrist 19 49 ?49 18.3     A.Elbow ADM 9.4  7.0  96.8 A.Elbow - B.Elbow 12 49 ?49 18.5         A.Elbow - Wrist               SNC    Nerve / Sites Rec. Site Peak Lat Ref.  Amp Ref. Segments Distance    ms ms V V  cm  R Radial - Anatomical snuff box (Forearm)     Forearm Wrist 2.8 ?2.9 31 ?15 Forearm - Wrist 10  R Median - Orthodromic (Dig II, Mid palm)     Dig II Wrist 4.4 ?3.4 16 ?10 Dig II - Wrist 13  L Median - Orthodromic (Dig II, Mid palm)     Dig II Wrist 4.7 ?3.4 10 ?10 Dig II - Wrist 13  R Ulnar - Orthodromic, (Dig V, Mid palm)     Dig V Wrist 2.9 ?3.1 9 ?5 Dig V - Wrist 11  L Ulnar - Orthodromic, (Dig V, Mid palm)     Dig V Wrist 2.8 ?3.1 20 ?5 Dig V - Wrist 11  F  Wave   Nerve F Lat Ref.   ms ms  R Ulnar - ADM 31.8 ?32.0         EMG full       EMG Summary Table    Spontaneous MUAP Recruitment  Muscle IA Fib PSW Fasc Other Amp Dur. Poly Pattern  R. Deltoid Normal None None None _______ Normal Normal Normal Normal  R. Biceps brachii Normal None None None _______ Normal Normal Normal Normal  R. Triceps brachii Normal None None None _______ Normal Normal Normal Normal  R. Flexor carpi radialis Normal None None None _______ Normal Normal Normal Normal  R. First dorsal interosseous Normal None None None _______ Normal Normal Normal Normal  R. Cervical paraspinals Normal None None None _______ Normal Normal Normal Normal

## 2017-12-02 ENCOUNTER — Telehealth: Payer: Self-pay | Admitting: *Deleted

## 2017-12-02 NOTE — Telephone Encounter (Signed)
Thanks Lauren for providing this information to her.

## 2017-12-02 NOTE — Telephone Encounter (Signed)
Patient left message on nurse line requesting return call to answer questions about diabetes. Returned call. Patient states she's getting new glucometer and needed to know if she has Type 1 or Type 2 diabetes. Explained that she has T2 and the difference between T1 and T2. Discussed last A1c of 5.9 and what BS control this number reflects. Patient states she sometimes only eats one meal a day and notes BS is 150. Discussed importance of eating 3 meals per day and snacking in between; not to go more than 4-5 hours in between eating. Patient states understanding and was very appreciative. Hubbard Hartshorn, RN, BSN

## 2017-12-05 ENCOUNTER — Encounter: Payer: Self-pay | Admitting: Family Medicine

## 2018-01-20 ENCOUNTER — Ambulatory Visit: Payer: Managed Care, Other (non HMO) | Admitting: Family Medicine

## 2018-02-14 ENCOUNTER — Other Ambulatory Visit: Payer: Self-pay | Admitting: Family Medicine

## 2018-02-14 DIAGNOSIS — Z139 Encounter for screening, unspecified: Secondary | ICD-10-CM

## 2018-02-21 ENCOUNTER — Other Ambulatory Visit: Payer: Self-pay | Admitting: Family Medicine

## 2018-02-26 NOTE — Telephone Encounter (Signed)
Please ask patient to schedule follow up with Dr. Gwendlyn Deutscher when she returns in mid-May. Thanks, Leeanne Rio, MD

## 2018-02-26 NOTE — Telephone Encounter (Signed)
Pt informed and appt made for 04/04/18. Danielle Mccann, Salome Spotted, CMA

## 2018-03-21 ENCOUNTER — Ambulatory Visit
Admission: RE | Admit: 2018-03-21 | Discharge: 2018-03-21 | Disposition: A | Discharge status: Home / Self Care | Payer: Managed Care, Other (non HMO) | Source: Ambulatory Visit | Attending: Family Medicine | Admitting: Family Medicine

## 2018-03-21 DIAGNOSIS — Z139 Encounter for screening, unspecified: Secondary | ICD-10-CM

## 2018-04-04 ENCOUNTER — Encounter: Payer: Self-pay | Admitting: Family Medicine

## 2018-04-04 ENCOUNTER — Other Ambulatory Visit: Payer: Self-pay

## 2018-04-04 ENCOUNTER — Ambulatory Visit (INDEPENDENT_AMBULATORY_CARE_PROVIDER_SITE_OTHER): Payer: Managed Care, Other (non HMO) | Admitting: Family Medicine

## 2018-04-04 VITALS — BP 116/70 | HR 71 | Temp 98.7°F | Ht 64.0 in | Wt 164.0 lb

## 2018-04-04 DIAGNOSIS — E785 Hyperlipidemia, unspecified: Secondary | ICD-10-CM

## 2018-04-04 DIAGNOSIS — E663 Overweight: Secondary | ICD-10-CM | POA: Diagnosis not present

## 2018-04-04 DIAGNOSIS — E1165 Type 2 diabetes mellitus with hyperglycemia: Secondary | ICD-10-CM

## 2018-04-04 DIAGNOSIS — R630 Anorexia: Secondary | ICD-10-CM

## 2018-04-04 HISTORY — DX: Anorexia: R63.0

## 2018-04-04 LAB — POCT GLYCOSYLATED HEMOGLOBIN (HGB A1C): HbA1c, POC (controlled diabetic range): 5.7 % (ref 0.0–7.0)

## 2018-04-04 NOTE — Progress Notes (Signed)
  Subjective:     Patient ID: Danielle Mccann, female   DOB: 09-18-1961, 57 y.o.   MRN: [id number]  HPI DM2:Compliant with Metformin 541m XL qd. Denies any concern. Appetite:She eats at least 1-2 times daily. She drinks coffee in the morning that takes away her appetite. She is not happy she has lost weight. She stated she wants some fat in her body.  Current Outpatient Medications on File Prior to Visit  Medication Sig Dispense Refill  . metFORMIN (GLUCOPHAGE-XR) 500 MG 24 hr tablet TAKE 1 TABLET BY MOUTH EVERY DAY WITH BREAKFAST 90 tablet 0  . Blood Glucose Calibration (FREESTYLE CONTROL SOLUTION) LIQD Use to check glucose once daily 1 Bottle 5  . Blood Glucose Monitoring Suppl (FREESTYLE FREEDOM LITE) w/Device KIT USE TO CHECK GLUCOSE ONCE DAILY  0  . glucose blood (FREESTYLE INSULINX TEST) test strip Use to check glucose once daily 100 each 12  . glucose monitoring kit (FREESTYLE) monitoring kit Use to check glucose once daily 1 each 0  . Lancets (FREESTYLE) lancets Use to check glucose once daily 100 each 12  . sertraline (ZOLOFT) 50 MG tablet Take 1 tablet (50 mg total) by mouth daily. (Patient not taking: Reported on 05/10/2017) 90 tablet 1   No current facility-administered medications on file prior to visit.    Past Medical History:  Diagnosis Date  . Smoking 1/2 pack a day or less   . Smoking 1/2 pack a day or less    Vitals:   04/04/18 0838  BP: 116/70  Pulse: 71  Temp: 98.7 F (37.1 C)  TempSrc: Oral  SpO2: 96%  Weight: 164 lb (74.4 kg)  Height: 5' 4" (1.626 m)     Review of Systems  Respiratory: Negative.   Cardiovascular: Negative.   Gastrointestinal: Negative.   Neurological: Negative.   All other systems reviewed and are negative.      Objective:   Physical Exam  Constitutional: She is oriented to person, place, and time. She appears well-developed. No distress.  Cardiovascular: Normal rate, regular rhythm and normal heart sounds.  No murmur  heard. Pulmonary/Chest: Effort normal and breath sounds normal. No stridor. No respiratory distress.  Abdominal: Soft. Bowel sounds are normal. She exhibits no mass. There is no tenderness.  Musculoskeletal: Normal range of motion. She exhibits no edema.  Neurological: She is alert and oriented to person, place, and time. No cranial nerve deficit.  Nursing note and vitals reviewed.      Assessment:     DM2 well controlled Overweight Poor appetite    Plan:     Check problem list.

## 2018-04-04 NOTE — Patient Instructions (Signed)
PLEASE START BABY ASPIRIN. SEE YOU BACK IN 4 MONTHS  BMI for Adults Body mass index (BMI) is a number that is calculated from a person's weight and height. In most adults, the number is used to find how much of an adult's weight is made up of fat. BMI is not as accurate as a direct measure of body fat. How is BMI calculated? BMI is calculated by dividing weight in kilograms by height in meters squared. It can also be calculated by dividing weight in pounds by height in inches squared, then multiplying the resulting number by 703. Charts are available to help you find your BMI quickly and easily without doing this calculation. How is BMI interpreted? Health care professionals use BMI charts to identify whether an adult is underweight, at a normal weight, or overweight based on the following guidelines:  Underweight: BMI less than 18.5.  Normal weight: BMI between 18.5 and 24.9.  Overweight: BMI between 25 and 29.9.  Obese: BMI of 30 and above.  BMI is usually interpreted the same for males and females. Weight includes both fat and muscle, so someone with a muscular build, such as an athlete, may have a BMI that is higher than 24.9. In cases like these, BMI may not accurately depict body fat. To determine if excess body fat is the cause of a BMI of 25 or higher, further assessments may need to be done by a health care provider. Why is BMI a useful tool? BMI is used to identify a possible weight problem that may be related to a medical problem or may increase the risk for medical problems. BMI can also be used to promote changes to reach a healthy weight. This information is not intended to replace advice given to you by your health care provider. Make sure you discuss any questions you have with your health care provider. Document Released: 07/03/2004 Document Revised: 03/01/2016 Document Reviewed: 03/19/2014 Elsevier Interactive Patient Education  [employer].

## 2018-04-04 NOTE — Assessment & Plan Note (Addendum)
Well controlled. A1C 5.7 COntinue current dose of Metformin. Return in 4 months for reassessment. Screen hyperlipidemia today.

## 2018-04-04 NOTE — Assessment & Plan Note (Signed)
MVI recommended. Avoid excessive coffee. Further work-up if it worsens.

## 2018-04-04 NOTE — Assessment & Plan Note (Signed)
Last 6 lbs since last visit about 3 months ago. BMI dropped from 29 to 28. I actually feel this is good for her DM. Diet and exercise for moderate weight loss counseling done. Monitor weight closely.

## 2018-04-05 ENCOUNTER — Telehealth: Payer: Self-pay | Admitting: Family Medicine

## 2018-04-05 LAB — LIPID PANEL
CHOL/HDL RATIO: 3.7 ratio (ref 0.0–4.4)
Cholesterol, Total: 153 mg/dL (ref 100–199)
HDL: 41 mg/dL (ref >39)
LDL CALC: 79 mg/dL (ref 0–99)
TRIGLYCERIDES: 165 mg/dL — AB (ref 0–149)
VLDL Cholesterol Cal: 33 mg/dL (ref 5–40)

## 2018-04-05 LAB — BASIC METABOLIC PANEL
BUN / CREAT RATIO: 12 (ref 9–23)
BUN: 9 mg/dL (ref 6–24)
CO2: 21 mmol/L (ref 20–29)
CREATININE: 0.73 mg/dL (ref 0.57–1.00)
Calcium: 9.1 mg/dL (ref 8.7–10.2)
Chloride: 101 mmol/L (ref 96–106)
GFR, EST AFRICAN AMERICAN: 106 mL/min/{1.73_m2} (ref >59)
GFR, EST NON AFRICAN AMERICAN: 92 mL/min/{1.73_m2} (ref >59)
Glucose: 174 mg/dL — ABNORMAL HIGH (ref 65–99)
Potassium: 4.2 mmol/L (ref 3.5–5.2)
SODIUM: 140 mmol/L (ref 134–144)

## 2018-04-05 MED ORDER — FISH OIL 1000 MG PO CAPS: 1.0000 | ORAL_CAPSULE | Freq: Two times a day (BID) | ORAL | 0 refills | Status: DC

## 2018-04-05 NOTE — Telephone Encounter (Signed)
Lab result discussed with patient./ Start Fish oil 2 tab BID for hypertriglyceridemia.

## 2018-05-25 ENCOUNTER — Other Ambulatory Visit: Payer: Self-pay | Admitting: Family Medicine

## 2018-08-15 ENCOUNTER — Ambulatory Visit (INDEPENDENT_AMBULATORY_CARE_PROVIDER_SITE_OTHER): Payer: Managed Care, Other (non HMO) | Admitting: Family Medicine

## 2018-08-15 ENCOUNTER — Encounter: Payer: Self-pay | Admitting: Family Medicine

## 2018-08-15 VITALS — BP 122/76 | HR 73 | Temp 98.6°F | Ht 64.0 in | Wt 162.0 lb

## 2018-08-15 DIAGNOSIS — E781 Pure hyperglyceridemia: Secondary | ICD-10-CM | POA: Insufficient documentation

## 2018-08-15 DIAGNOSIS — Z794 Long term (current) use of insulin: Secondary | ICD-10-CM | POA: Diagnosis not present

## 2018-08-15 DIAGNOSIS — R630 Anorexia: Secondary | ICD-10-CM

## 2018-08-15 DIAGNOSIS — E119 Type 2 diabetes mellitus without complications: Secondary | ICD-10-CM

## 2018-08-15 LAB — POCT GLYCOSYLATED HEMOGLOBIN (HGB A1C): HbA1c, POC (controlled diabetic range): 6 % (ref 0.0–7.0)

## 2018-08-15 NOTE — Patient Instructions (Addendum)
It was nice seeing you today. I am srry about your poor appetite. This could be due to your coffee intake and also Metformin. Please cut back on coffee intake and start multivitamin. We will see you back in about 3 months.

## 2018-08-15 NOTE — Progress Notes (Signed)
  Subjective:     Patient ID: Danielle Mccann, female   DOB: Sep 18, 1961, 57 y.o.   MRN: [id number]  HPI DM2:Compliant with Metformin 500 mg qd. Denies polyuria or polydipsia. Her home CBG has been running high in the morning. Today her FBG was 148. She usually wakes in the middle of the night to snack. Poor appetite: C/O poor appetite over the last few months. She drinks two cups of coffee daily and will not feel hungry the whole day. She will wake up in the middle of the night to snack on junk food. Denies other GI symptoms.  Current Outpatient Medications on File Prior to Visit  Medication Sig Dispense Refill  . Blood Glucose Calibration (FREESTYLE CONTROL SOLUTION) LIQD Use to check glucose once daily 1 Bottle 5  . Blood Glucose Monitoring Suppl (FREESTYLE FREEDOM LITE) w/Device KIT USE TO CHECK GLUCOSE ONCE DAILY  0  . glucose blood (FREESTYLE INSULINX TEST) test strip Use to check glucose once daily 100 each 12  . glucose monitoring kit (FREESTYLE) monitoring kit Use to check glucose once daily 1 each 0  . Lancets (FREESTYLE) lancets Use to check glucose once daily 100 each 12  . metFORMIN (GLUCOPHAGE-XR) 500 MG 24 hr tablet TAKE 1 TABLET BY MOUTH EVERY DAY WITH BREAKFAST 90 tablet 2  . Omega-3 Fatty Acids (FISH OIL) 1000 MG CAPS Take 1 capsule (1,000 mg total) by mouth 2 (two) times daily. 180 capsule 0  . sertraline (ZOLOFT) 50 MG tablet Take 1 tablet (50 mg total) by mouth daily. (Patient not taking: Reported on 05/10/2017) 90 tablet 1   No current facility-administered medications on file prior to visit.    Past Medical History:  Diagnosis Date  . Smoking 1/2 pack a day or less   . Smoking 1/2 pack a day or less    Vitals:   08/15/18 0844  BP: 122/76  Pulse: 73  Temp: 98.6 F (37 C)  TempSrc: Oral  SpO2: 98%  Weight: 162 lb (73.5 kg)  Height: '5\' 4"'$  (1.626 m)     Review of Systems  Respiratory: Negative.   Cardiovascular: Negative.   Gastrointestinal:       Poor  appetite  Neurological: Negative.   All other systems reviewed and are negative.      Objective:   Physical Exam  Constitutional: She is oriented to person, place, and time. She appears well-developed. No distress.  Cardiovascular: Normal rate, regular rhythm and normal heart sounds.  No murmur heard. Pulmonary/Chest: Effort normal and breath sounds normal. No stridor. No respiratory distress. She has no wheezes.  Abdominal: Soft. Bowel sounds are normal. She exhibits no distension and no mass. There is no tenderness.  Neurological: She is alert and oriented to person, place, and time.  Nursing note and vitals reviewed.      Assessment:     DM2 Poor appetite    Plan:     Check problem list.

## 2018-08-15 NOTE — Assessment & Plan Note (Signed)
Likely due to coffee and Metformin. I recommended that she cut back on her coffee intake. Start MVI. Being deliberate about eating will be helpful. I encouraged her to set eating schedule and stick by it. She agreed with the plan.

## 2018-08-15 NOTE — Assessment & Plan Note (Addendum)
A1C checked today. Compliant with medication. Poor diet habit (fast all day and snack in the middle of the night). Discussed diet change during this visit. I recommended calorie and Carb intake count daily till next visit. Continue home CBG monitoring. Urine microalbumin checked. Holding off on ACEi due to low normal BP. I will discuss with her if she is interested in low dose ACEi. A1C still pending. I will call her with the result as she does not want to wait for the result. F/U in 3 months.   I called and discussed A1C result with her. Continue current dose of Metformin and improve on diet. I discussed indication for ACEi or ARBs for renal protection in diabetic patient. She prefers to hold off on starting ACEi for now. F/U urine microalbumin result. Return in 3 months.

## 2018-08-16 LAB — MICROALBUMIN / CREATININE URINE RATIO
Creatinine, Urine: 95.4 mg/dL
Microalb/Creat Ratio: 5.9 mg/g creat (ref 0.0–30.0)
Microalbumin, Urine: 5.6 ug/mL

## 2018-08-25 ENCOUNTER — Other Ambulatory Visit: Payer: Self-pay | Admitting: Family Medicine

## 2018-08-25 ENCOUNTER — Telehealth: Payer: Self-pay

## 2018-08-25 MED ORDER — VARENICLINE TARTRATE 0.5 MG X 11 & 1 MG X 42 PO MISC: ORAL | 0 refills | Status: DC

## 2018-08-25 NOTE — Telephone Encounter (Signed)
RX placed up front for faxing.

## 2018-08-25 NOTE — Telephone Encounter (Signed)
Patient left message on nurse line asking for PCP to prescribe Chantix to help her stop smoking.  Call back is [phone].  Danley Danker, RN [facility] RN)

## 2018-09-29 ENCOUNTER — Other Ambulatory Visit: Payer: Self-pay | Admitting: Family Medicine

## 2018-09-29 ENCOUNTER — Other Ambulatory Visit: Payer: Self-pay

## 2018-09-29 ENCOUNTER — Ambulatory Visit (INDEPENDENT_AMBULATORY_CARE_PROVIDER_SITE_OTHER): Payer: Managed Care, Other (non HMO) | Admitting: Family Medicine

## 2018-09-29 VITALS — BP 122/74 | HR 79 | Temp 98.8°F | Ht 64.0 in | Wt 166.2 lb

## 2018-09-29 DIAGNOSIS — Z206 Contact with and (suspected) exposure to human immunodeficiency virus [HIV]: Secondary | ICD-10-CM

## 2018-09-29 NOTE — Patient Instructions (Signed)
  Nice to see you We'll call you with your lab results  If you have questions or concerns please do not hesitate to call at [phone].  Lucila Maine, DO PGY-3, [facility] 09/29/2018 10:40 AM

## 2018-09-29 NOTE — Assessment & Plan Note (Addendum)
  Sexually active with partner who may have HIV- patient is unsure. Last active 2 weeks ago so outside of 72 hour window for PEP. Active from Sept-Nov. Discussed with Dr. Tommy Medal with ID. He recommended checking HIV ab/ag, hep B, RPR. He recommended PREP. Discussed with patient. She is unsure if she will continue to have a relationship with this person. She plans to ask him about his HIV status. If he is HIV+ and on treatment with undetectable viral load the chance of trasmitting virus to patient is extremely low, which I told her. Again she is unsure if this is just a rumor or not. Will check labs and inform her of results via phone. Patient verbalized understanding and agreement with plan.

## 2018-09-29 NOTE — Progress Notes (Signed)
    Subjective:    Patient ID: Danielle Mccann, female    DOB: 04-May-1961, 57 y.o.   MRN: [id number]   CC: exposure to HIV  HPI: patient has been sexually active with a female partner without protection from September until 2 weeks ago. She was told by a friend that he may have HIV and has been active with a woman who has HIV. Patient became concerned, especially because she had a yeast infection which is unusual for her. She wants to be tested for HIV today. She has not discussed this with the partner and asked him his HIV status but plans to do so. She is unsure if these are just rumors but wants to be on the safe side. The yeast infection has resolved with use of OTC monistat, she is otherwise feeling well.  Smoking status reviewed-former smoker  Review of Systems- see HPI   Objective:  BP 122/74   Pulse 79   Temp 98.8 F (37.1 C) (Oral)   Ht 5\' 4"  (1.626 m)   Wt 166 lb 3.2 oz (75.4 kg)   SpO2 98%   BMI 28.53 kg/m  Vitals and nursing note reviewed  General: well nourished, in no acute distress HEENT: normocephalic, MMM Cardiac: Regular rat Respiratory: no increased work of breathing Neuro: alert and oriented, no focal deficits Psych: appropriate mood and affect  Assessment & Plan:    Exposure to HIV  Sexually active with partner who may have HIV- patient is unsure. Last active 2 weeks ago so outside of 72 hour window for PEP. Active from Sept-Nov. Discussed with Dr. Tommy Medal with ID. He recommended checking HIV ab/ag, hep B, RPR. He recommended PREP. Discussed with patient. She is unsure if she will continue to have a relationship with this person. She plans to ask him about his HIV status. If he is HIV+ and on treatment with undetectable viral load the chance of trasmitting virus to patient is extremely low, which I told her. Again she is unsure if this is just a rumor or not. Will check labs and inform her of results via phone. Patient verbalized understanding and  agreement with plan.     Return in about 3 months (around 12/30/2018).   Lucila Maine, DO Family Medicine Resident PGY-3

## 2018-09-30 LAB — RPR: RPR: NONREACTIVE

## 2018-09-30 LAB — HEPATITIS B SURFACE ANTIBODY,QUALITATIVE: Hep B Surface Ab, Qual: REACTIVE

## 2018-10-01 ENCOUNTER — Telehealth: Payer: Self-pay | Admitting: Family Medicine

## 2018-10-01 NOTE — Telephone Encounter (Signed)
  Called [name], we are still waiting on her HIV results to come back so I wanted to let her know that before we go into the holiday weekend. I am working Saturday evening at the hospital so I told her I would call her then with the results which should be back by then. She is appreciative of call. She did not speak with the partner to clarify possible HIV infection yet. She wants to wait fo results.   Lucila Maine, DO PGY-3, [facility] 10/01/2018 12:02 PM

## 2018-10-03 LAB — HIV ANTIBODY (ROUTINE TESTING W REFLEX): HIV SCREEN 4TH GENERATION: NONREACTIVE

## 2018-10-03 LAB — SPECIMEN STATUS REPORT

## 2018-10-04 ENCOUNTER — Telehealth: Payer: Self-pay | Admitting: Family Medicine

## 2018-10-04 NOTE — Telephone Encounter (Signed)
  Called Ms. Therrien, we still do not have the lab result for HIV back. I apologized for inconvenience. She is frustrated but understanding. I told her we will look into this more on Monday.  Lucila Maine, DO PGY-3, [facility] 10/04/2018 8:31 PM

## 2018-10-06 ENCOUNTER — Telehealth (HOSPITAL_COMMUNITY): Payer: Self-pay | Admitting: Family Medicine

## 2018-10-06 NOTE — Telephone Encounter (Signed)
Called Ms. Rohleder to inform her of negative lab results.

## 2018-10-20 ENCOUNTER — Encounter (HOSPITAL_COMMUNITY): Payer: Self-pay | Admitting: Emergency Medicine

## 2018-10-20 ENCOUNTER — Emergency Department (HOSPITAL_COMMUNITY)
Admission: EM | Admit: 2018-10-20 | Discharge: 2018-10-20 | Disposition: A | Discharge status: Home / Self Care | Payer: Managed Care, Other (non HMO) | Attending: Emergency Medicine | Admitting: Emergency Medicine

## 2018-10-20 ENCOUNTER — Emergency Department (HOSPITAL_COMMUNITY): Payer: Managed Care, Other (non HMO)

## 2018-10-20 DIAGNOSIS — S8262XA Displaced fracture of lateral malleolus of left fibula, initial encounter for closed fracture: Secondary | ICD-10-CM | POA: Insufficient documentation

## 2018-10-20 DIAGNOSIS — Z87891 Personal history of nicotine dependence: Secondary | ICD-10-CM | POA: Diagnosis not present

## 2018-10-20 DIAGNOSIS — Y939 Activity, unspecified: Secondary | ICD-10-CM | POA: Diagnosis not present

## 2018-10-20 DIAGNOSIS — W109XXA Fall (on) (from) unspecified stairs and steps, initial encounter: Secondary | ICD-10-CM | POA: Diagnosis not present

## 2018-10-20 DIAGNOSIS — Y929 Unspecified place or not applicable: Secondary | ICD-10-CM | POA: Insufficient documentation

## 2018-10-20 DIAGNOSIS — E119 Type 2 diabetes mellitus without complications: Secondary | ICD-10-CM | POA: Diagnosis not present

## 2018-10-20 DIAGNOSIS — S99812A Other specified injuries of left ankle, initial encounter: Secondary | ICD-10-CM | POA: Diagnosis present

## 2018-10-20 DIAGNOSIS — Y999 Unspecified external cause status: Secondary | ICD-10-CM | POA: Diagnosis not present

## 2018-10-20 DIAGNOSIS — Z7984 Long term (current) use of oral hypoglycemic drugs: Secondary | ICD-10-CM | POA: Insufficient documentation

## 2018-10-20 HISTORY — DX: Type 2 diabetes mellitus without complications: E11.9

## 2018-10-20 NOTE — ED Provider Notes (Signed)
[facility] EMERGENCY DEPARTMENT Provider Note   CSN: [id number] Arrival date & time: 10/20/18  2135     History   Chief Complaint Chief Complaint  Patient presents with  . Ankle Pain    HPI Danielle Mccann is a 57 y.o. female.   57 year old female presents to the emergency department for evaluation of left ankle pain.  She states that she fell down 3 steps while putting up her Christmas decorations.  She was able to grab some bushes to soften her fall.  Complaining of left ankle pain which is aggravated with weightbearing.  Has been unable to weight-bear since the incident.  Denies taking any medications for symptoms.  No numbness or paresthesias.  Denies history of prior foot or ankle injury.  Is not followed by an orthopedist, but is specifically requesting to see Dr. Daylene Katayama should she require outpatient follow-up.     Past Medical History:  Diagnosis Date  . Diabetes mellitus without complication ([place])   . Smoking 1/2 pack a day or less   . Smoking 1/2 pack a day or less     Patient Active Problem List   Diagnosis Date Noted  . Exposure to HIV 09/29/2018  . Hypertriglyceridemia 08/15/2018  . Overweight 04/04/2018  . Paresthesia 09/24/2017  . Skin tag 06/19/2017  . Anxiety state 01/04/2017  . DM (diabetes mellitus), type 2 ([place]) 07/20/2016  . Menopause 09/12/2010  . Overweight(278.02) 04/07/2008    Past Surgical History:  Procedure Laterality Date  . NO PAST SURGERIES       OB History   No obstetric history on file.      Home Medications    Prior to Admission medications   Medication Sig Start Date End Date Taking? Authorizing Provider  Blood Glucose Calibration (FREESTYLE CONTROL SOLUTION) LIQD Use to check glucose once daily Patient not taking: Reported on 08/15/2018 07/20/16   Kinnie Feil, MD  Blood Glucose Monitoring Suppl (FREESTYLE FREEDOM LITE) w/Device KIT USE TO CHECK GLUCOSE ONCE DAILY 07/20/16   [provider]  glucose blood (FREESTYLE INSULINX TEST) test strip Use to check glucose once daily Patient not taking: Reported on 08/15/2018 09/24/17   Kinnie Feil, MD  glucose monitoring kit (FREESTYLE) monitoring kit Use to check glucose once daily Patient not taking: Reported on 08/15/2018 07/20/16   Kinnie Feil, MD  Lancets (FREESTYLE) lancets Use to check glucose once daily Patient not taking: Reported on 08/15/2018 09/24/17   Kinnie Feil, MD  metFORMIN (GLUCOPHAGE-XR) 500 MG 24 hr tablet TAKE 1 TABLET BY MOUTH EVERY DAY WITH BREAKFAST 05/26/18   Kinnie Feil, MD  Omega-3 Fatty Acids (FISH OIL) 1000 MG CAPS Take 1 capsule (1,000 mg total) by mouth 2 (two) times daily. 04/05/18   Kinnie Feil, MD  sertraline (ZOLOFT) 50 MG tablet Take 1 tablet (50 mg total) by mouth daily. Patient not taking: Reported on 05/10/2017 01/04/17   Kinnie Feil, MD  varenicline (CHANTIX PAK) 0.5 MG X 11 & 1 MG X 42 tablet Take one 0.5 mg tablet by mouth once daily for 3 days, then increase to one 0.5 mg tablet twice daily for 4 days, then increase to one 1 mg tablet twice daily. 08/25/18   Kinnie Feil, MD    Family History Family History  Problem Relation Age of Onset  . Stroke Mother   . Diabetes Mother   . Hypertension Mother   . Alcohol abuse Father   .  Diabetes Father   . Hypertension Father   . Heart disease Neg Hx   . Cancer Neg Hx   . Colon cancer Neg Hx     Social History Social History   Tobacco Use  . Smoking status: Former Smoker    Packs/day: 0.50    Types: Cigarettes    Last attempt to quit: 08/22/2015    Years since quitting: 3.1  . Smokeless tobacco: Never Used  Substance Use Topics  . Alcohol use: No    Alcohol/week: 0.0 standard drinks  . Drug use: No    Types: Marijuana     Allergies   Patient has no known allergies.   Review of Systems Review of Systems Ten systems reviewed and are negative for acute change, except as noted in the  HPI.    Physical Exam Updated Vital Signs BP 117/70 (BP Location: Right Arm)   Pulse 64   Temp 97.9 F (36.6 C) (Oral)   Resp 18   Ht '5\' 4"'$  (1.626 m)   Wt 73.5 kg   SpO2 98%   BMI 27.81 kg/m   Physical Exam Vitals signs and nursing note reviewed.  Constitutional:      General: She is not in acute distress.    Appearance: She is well-developed. She is not diaphoretic.     Comments: Nontoxic appearing and in NAD  HENT:     Head: Normocephalic and atraumatic.  Eyes:     General: No scleral icterus.    Conjunctiva/sclera: Conjunctivae normal.  Neck:     Musculoskeletal: Normal range of motion.  Cardiovascular:     Rate and Rhythm: Normal rate and regular rhythm.     Pulses: Normal pulses.     Comments: DP pulse 2+ in the LLE Pulmonary:     Effort: Pulmonary effort is normal. No respiratory distress.     Comments: Respirations even and unlabored Musculoskeletal: Normal range of motion.     Comments: TTP with swelling to the lateral malleolus of the L ankle. No bony deformity or crepitus. ROM limited 2/2 pain.   Skin:    General: Skin is warm and dry.     Coloration: Skin is not pale.     Findings: No erythema or rash.  Neurological:     Mental Status: She is alert and oriented to person, place, and time.     Comments: Station to light touch intact in the left lower extremity.  Patient able to wiggle all toes.  Psychiatric:        Behavior: Behavior normal.      ED Treatments / Results  Labs (all labs ordered are listed, but only abnormal results are displayed) Labs Reviewed - No data to display  EKG None  Radiology Dg Ankle Complete Left  Result Date: 10/20/2018 CLINICAL DATA:  Lateral ankle pain after fall. EXAM: LEFT ANKLE COMPLETE - 3+ VIEW COMPARISON:  None. FINDINGS: Questionable nondisplaced avulsion fracture at the tip of the lateral malleolus. Prominent overlying soft tissue swelling. The ankle mortise is symmetric. The talar dome is intact. No  tibiotalar joint effusion. Joint spaces are preserved. Bone mineralization is normal. Small Achilles enthesophyte. IMPRESSION: 1. Questionable nondisplaced avulsion fracture at the tip of the lateral malleolus with prominent overlying soft tissue swelling. Electronically Signed   By: Titus Dubin M.D.   On: 10/20/2018 22:14    Procedures Procedures (including critical care time)  Medications Ordered in ED Medications - No data to display   Initial Impression / Assessment and  Plan / ED Course  I have reviewed the triage vital signs and the nursing notes.  Pertinent labs & imaging results that were available during my care of the patient were reviewed by me and considered in my medical decision making (see chart for details).     Patient presents to the emergency department for evaluation of L ankle pain. Patient neurovascularly intact on exam. No erythema, heat to touch to the affected area; no concern for septic joint. Compartments in the affected extremity are soft.   She does have evidence of possible avulsion fracture to the lower aspect of her left fibula/lateral malleolus.  This most likely represents muscle or ligamentous injury.  She was placed in a CAM walker for stability.  Plan for supportive management including RICE and NSAIDs; referred to podiatry rather than orthopedics at patient request. Return precautions discussed and provided. Patient discharged in stable condition with no unaddressed concerns.   Final Clinical Impressions(s) / ED Diagnoses   Final diagnoses:  Closed avulsion fracture of lateral malleolus of left fibula, initial encounter    ED Discharge Orders    None       Antonietta Breach, PA-C 10/20/18 2347    Blanchie Dessert, MD 10/21/18 0002

## 2018-10-20 NOTE — Discharge Instructions (Addendum)
Wear a walking boot until you are able to follow-up with Dr. Amalia Hailey in the office.  Take 600 mg ibuprofen every 6 hours for management of pain.  Ice your ankle 3-4 times per day to limit inflammation/swelling.  Try to keep your foot elevated as much as you are able.  Return to the ED for new or concerning symptoms.

## 2018-10-20 NOTE — ED Triage Notes (Signed)
Pt had a mechanical fall down three steps, reports she "grabed some bushes to soften her fall.  Pt reports left ankle pain and is unable to put any weight on it.

## 2018-10-22 ENCOUNTER — Encounter: Payer: Self-pay | Admitting: Podiatry

## 2018-10-22 ENCOUNTER — Ambulatory Visit (INDEPENDENT_AMBULATORY_CARE_PROVIDER_SITE_OTHER): Payer: Managed Care, Other (non HMO) | Admitting: Podiatry

## 2018-10-22 VITALS — BP 113/58 | HR 68

## 2018-10-22 DIAGNOSIS — S93402A Sprain of unspecified ligament of left ankle, initial encounter: Secondary | ICD-10-CM

## 2018-10-26 NOTE — Progress Notes (Signed)
   HPI: 57 year old female presenting today as a new patient with a chief complaint of an injury to the left lateral ankle that occurred two days ago. She states she was putting up Christmas lights and fell down three stairs. She reports severe pain with associated swelling of the ankle. She was seen in the ED and had X-Rays done that showed a possible avulsion fracture of the tip of the lateral malleolus. She was placed in a CAM boot and instructed to take NSAIDs for treatment which she has been doing. Walking and bearing weight increases the pain. Patient is here for further evaluation and treatment.   Past Medical History:  Diagnosis Date  . Diabetes mellitus without complication ([place])   . Smoking 1/2 pack a day or less   . Smoking 1/2 pack a day or less      Physical Exam: General: The patient is alert and oriented x3 in no acute distress.  Dermatology: Skin is warm, dry and supple bilateral lower extremities. Negative for open lesions or macerations.  Vascular: Palpable pedal pulses bilaterally. No erythema noted. Capillary refill within normal limits.  Neurological: Epicritic and protective threshold grossly intact bilaterally.   Musculoskeletal Exam: Pain with palpation with heavy edema of the left ankle noted. Range of motion within normal limits to all pedal and ankle joints bilateral. Muscle strength 5/5 in all groups bilateral.   Assessment: 1. Severe ankle sprain left    Plan of Care:  1. Patient evaluated. X-Rays from 10/20/18 reviewed.  2. Continue using CAM boot. Weightbearing as tolerated.  3. Compression anklet dispensed.  4. Continue taking Motrin 800 mg daily.  5. Return to clinic in 4 weeks.   Goes by Kennyth Lose. I did her daughter's surgery.      Edrick Kins, DPM [facility]  Dr. Edrick Kins, DPM    [address], [place] [address]                Office [phone]  Fax [phone]

## 2018-10-30 ENCOUNTER — Telehealth: Payer: Self-pay | Admitting: Podiatry

## 2018-10-30 NOTE — Telephone Encounter (Signed)
Pt has chipped bone is ankle and is wearing a boot. The boot is making her ankle hurt and was wondering if there is any type of cushion she can put in the boot against her ankle. Please give pt a call back

## 2018-10-30 NOTE — Telephone Encounter (Signed)
I called pt and she states the boot hurts her foot even when she isn't walking it presses on the foot. I told pt I would help her with the padding of the area either today before 5:00pm or before 1:00pm tomorrow. Pt states she will come in around 9:30am tomorrow, I told pt she could try to pad off the painful area with a soft cloth until she is seen tomorrow.

## 2018-10-31 NOTE — Telephone Encounter (Signed)
Pt presents to office for fitting of her CAM walker, complains of pain at medial and lateral ankle bone areas. Pt is wearing short socks and thin legged jeans she has pressed into the CAM walker, with thickened areas of material at the areas of discomfort. I removed the short sock and rolled the jeans up loosely and placed pt in a surgical stockingette over the compression sock and replaced the CAM boot. I pumped air into the CAM boot to stabilize the foot and leg, and loosened the straps to decrease pressure. Pt states the areas feel better. I told pt the longer sock would distribute the material more even, rather than have thickened fabric being pushed against the boney prominence areas. Pt states understanding.

## 2018-11-12 ENCOUNTER — Ambulatory Visit (HOSPITAL_COMMUNITY)
Admission: EM | Admit: 2018-11-12 | Discharge: 2018-11-12 | Disposition: A | Discharge status: Home / Self Care | Payer: Managed Care, Other (non HMO) | Attending: Family Medicine | Admitting: Family Medicine

## 2018-11-12 ENCOUNTER — Other Ambulatory Visit: Payer: Self-pay

## 2018-11-12 ENCOUNTER — Encounter (HOSPITAL_COMMUNITY): Payer: Self-pay | Admitting: Emergency Medicine

## 2018-11-12 DIAGNOSIS — R52 Pain, unspecified: Secondary | ICD-10-CM

## 2018-11-12 DIAGNOSIS — R69 Illness, unspecified: Secondary | ICD-10-CM | POA: Insufficient documentation

## 2018-11-12 DIAGNOSIS — R059 Cough, unspecified: Secondary | ICD-10-CM

## 2018-11-12 DIAGNOSIS — R05 Cough: Secondary | ICD-10-CM

## 2018-11-12 DIAGNOSIS — J111 Influenza due to unidentified influenza virus with other respiratory manifestations: Secondary | ICD-10-CM

## 2018-11-12 MED ORDER — OSELTAMIVIR PHOSPHATE 75 MG PO CAPS
75.0000 mg | ORAL_CAPSULE | Freq: Two times a day (BID) | ORAL | 0 refills | Status: AC
Start: 2018-11-12 — End: 2018-11-17

## 2018-11-12 MED ORDER — BENZONATATE 100 MG PO CAPS: 100.0000 mg | ORAL_CAPSULE | Freq: Three times a day (TID) | ORAL | 0 refills | Status: DC | PRN

## 2018-11-12 NOTE — ED Triage Notes (Signed)
Pt reports cough, body and head aches for the last few days.  She reports feeling warm but has not checked her temperature.  She has been taking Nyquil and Theraflu with no relief.

## 2018-11-12 NOTE — ED Provider Notes (Signed)
[place]    CSN: [id number] Arrival date & time: 11/12/18  1047     History   Chief Complaint Chief Complaint  Patient presents with  . Generalized Body Aches  . Cough    HPI Danielle Mccann is a 58 y.o. female.   58 year old female presents with headache and scratchy throat about 5 days ago. Then started developing a cough and body aches a few days ago. Continues to get worse with chills, ?fever, sweats and decreased appetite. No vomiting or diarrhea. Minimal nasal congestion. Has take Theraflu with some relief. House guests recently ill with respiratory symptoms. Did not have a flu vaccine. Current smoker. History includes DM and takes Metformin along with fish oil and aspirin daily.    The history is provided by the patient.    Past Medical History:  Diagnosis Date  . Diabetes mellitus without complication ([place])   . Smoking 1/2 pack a day or less   . Smoking 1/2 pack a day or less     Patient Active Problem List   Diagnosis Date Noted  . Exposure to HIV 09/29/2018  . Hypertriglyceridemia 08/15/2018  . Overweight 04/04/2018  . Paresthesia 09/24/2017  . Skin tag 06/19/2017  . Anxiety state 01/04/2017  . DM (diabetes mellitus), type 2 ([place]) 07/20/2016  . Menopause 09/12/2010  . Overweight(278.02) 04/07/2008    Past Surgical History:  Procedure Laterality Date  . NO PAST SURGERIES      OB History   No obstetric history on file.      Home Medications    Prior to Admission medications   Medication Sig Start Date End Date Taking? Authorizing Provider  Blood Glucose Calibration (FREESTYLE CONTROL SOLUTION) LIQD Use to check glucose once daily 07/20/16  Yes Eniola, Kehinde T, MD  Blood Glucose Monitoring Suppl (FREESTYLE FREEDOM LITE) w/Device KIT USE TO CHECK GLUCOSE ONCE DAILY 07/20/16  Yes [provider]  glucose blood (FREESTYLE INSULINX TEST) test strip Use to check glucose once daily 09/24/17  Yes Andrena Mews T, MD    glucose monitoring kit (FREESTYLE) monitoring kit Use to check glucose once daily 07/20/16  Yes Andrena Mews T, MD  Lancets (FREESTYLE) lancets Use to check glucose once daily 09/24/17  Yes Eniola, Tawanna Solo T, MD  metFORMIN (GLUCOPHAGE-XR) 500 MG 24 hr tablet TAKE 1 TABLET BY MOUTH EVERY DAY WITH BREAKFAST 05/26/18  Yes Kinnie Feil, MD  Omega-3 Fatty Acids (FISH OIL) 1000 MG CAPS Take 1 capsule (1,000 mg total) by mouth 2 (two) times daily. 04/05/18  Yes Kinnie Feil, MD  benzonatate (TESSALON) 100 MG capsule Take 1 capsule (100 mg total) by mouth 3 (three) times daily as needed for cough. 11/12/18   Katy Apo, NP  oseltamivir (TAMIFLU) 75 MG capsule Take 1 capsule (75 mg total) by mouth every 12 (twelve) hours for 5 days. 11/12/18 11/17/18  Katy Apo, NP    Family History Family History  Problem Relation Age of Onset  . Stroke Mother   . Diabetes Mother   . Hypertension Mother   . Alcohol abuse Father   . Diabetes Father   . Hypertension Father   . Heart disease Neg Hx   . Cancer Neg Hx   . Colon cancer Neg Hx     Social History Social History   Tobacco Use  . Smoking status: Former Smoker    Packs/day: 0.50    Types: Cigarettes    Last attempt to quit: 08/22/2015  Years since quitting: 3.2  . Smokeless tobacco: Never Used  Substance Use Topics  . Alcohol use: No    Alcohol/week: 0.0 standard drinks  . Drug use: No    Types: Marijuana     Allergies   Patient has no known allergies.   Review of Systems Review of Systems  Constitutional: Positive for activity change, appetite change, chills, fatigue and fever.  HENT: Positive for congestion (chest), postnasal drip, rhinorrhea and sore throat (irritated). Negative for ear discharge, ear pain, facial swelling, mouth sores, nosebleeds, sinus pressure, sinus pain, sneezing and trouble swallowing.   Eyes: Negative for pain, discharge, redness and itching.  Respiratory: Positive for cough and chest  tightness. Negative for shortness of breath and wheezing.   Cardiovascular: Negative for chest pain.  Gastrointestinal: Positive for nausea. Negative for abdominal pain, diarrhea and vomiting.  Musculoskeletal: Positive for arthralgias and myalgias. Negative for neck pain and neck stiffness.  Skin: Negative for color change, rash and wound.  Neurological: Positive for headaches. Negative for dizziness, tremors, seizures, syncope, weakness, light-headedness and numbness.  Hematological: Negative for adenopathy. Does not bruise/bleed easily.     Physical Exam Triage Vital Signs ED Triage Vitals [11/12/18 1127]  Enc Vitals Group     BP 119/62     Pulse Rate 73     Resp      Temp 99.2 F (37.3 C)     Temp Source Oral     SpO2 99 %     Weight      Height      Head Circumference      Peak Flow      Pain Score 8     Pain Loc      Pain Edu?      Excl. in [place]?    No data found.  Updated Vital Signs BP 119/62 (BP Location: Right Arm)   Pulse 73   Temp 99.2 F (37.3 C) (Oral)   SpO2 99%   Visual Acuity Right Eye Distance:   Left Eye Distance:   Bilateral Distance:    Right Eye Near:   Left Eye Near:    Bilateral Near:     Physical Exam Vitals signs and nursing note reviewed.  Constitutional:      General: She is awake. She is not in acute distress.    Appearance: Normal appearance. She is well-developed and well-groomed. She is ill-appearing.     Comments: Patient sitting comfortably on exam table in no acute distress but appears ill.   HENT:     Head: Normocephalic and atraumatic.     Right Ear: Hearing, tympanic membrane, ear canal and external ear normal.     Left Ear: Hearing, tympanic membrane, ear canal and external ear normal.     Nose: Rhinorrhea present. Rhinorrhea is clear.     Right Turbinates: Not enlarged or swollen.     Left Turbinates: Not enlarged or swollen.     Right Sinus: No maxillary sinus tenderness or frontal sinus tenderness.     Left Sinus:  No maxillary sinus tenderness or frontal sinus tenderness.     Mouth/Throat:     Lips: Pink.     Mouth: Mucous membranes are moist.     Pharynx: Uvula midline. Posterior oropharyngeal erythema present. No pharyngeal swelling or oropharyngeal exudate.  Eyes:     Extraocular Movements: Extraocular movements intact.     Conjunctiva/sclera: Conjunctivae normal.  Neck:     Musculoskeletal: Normal range of motion and neck supple.  No muscular tenderness.  Cardiovascular:     Rate and Rhythm: Normal rate and regular rhythm.     Pulses: Normal pulses.     Heart sounds: Normal heart sounds. No murmur.  Pulmonary:     Effort: Pulmonary effort is normal. No tachypnea or respiratory distress.     Breath sounds: No stridor or decreased air movement. Examination of the right-upper field reveals decreased breath sounds and wheezing. Examination of the left-upper field reveals decreased breath sounds and wheezing. Examination of the right-lower field reveals decreased breath sounds. Examination of the left-lower field reveals decreased breath sounds. Decreased breath sounds and wheezing present. No rhonchi or rales.     Comments: Respiratory rate = 18. Musculoskeletal: Normal range of motion.  Lymphadenopathy:     Cervical: No cervical adenopathy.  Skin:    General: Skin is warm and dry.     Capillary Refill: Capillary refill takes less than 2 seconds.  Neurological:     General: No focal deficit present.     Mental Status: She is alert and oriented to person, place, and time.  Psychiatric:        Mood and Affect: Mood normal.        Behavior: Behavior normal. Behavior is cooperative.        Thought Content: Thought content normal.        Judgment: Judgment normal.      UC Treatments / Results  Labs (all labs ordered are listed, but only abnormal results are displayed) Labs Reviewed - No data to display  EKG None  Radiology No results found.  Procedures Procedures (including critical  care time)  Medications Ordered in UC Medications - No data to display  Initial Impression / Assessment and Plan / UC Course  I have reviewed the triage vital signs and the nursing notes.  Pertinent labs & imaging results that were available during my care of the patient were reviewed by me and considered in my medical decision making (see chart for details).    Discussed with patient that she appears to have a viral illness. May have influenza. Discussed treatment options and patient desires anti-viral since more severe symptoms started 2 days ago. Will trial Tamiflu 75mg twice a day as directed. May take Tessalon cough pills 1 every 8 hours as needed. Increase fluids to help loosen up mucus. May continue Theraflu as directed. Follow-up with her PCP in 3 to 5 days if not improving.   Final Clinical Impressions(s) / UC Diagnoses   Final diagnoses:  Influenza-like illness  Cough  Generalized body aches     Discharge Instructions     Recommend start Tamiflu 75mg twice a day as directed. May take Tessalon cough pills every 8 hours as needed. Increase fluid intake to help loosen up mucus. May continue Theraflu as directed. Follow-up with your PCP in 3 to 5 days if not improving.     ED Prescriptions    Medication Sig Dispense Auth. Provider   oseltamivir (TAMIFLU) 75 MG capsule Take 1 capsule (75 mg total) by mouth every 12 (twelve) hours for 5 days. 10 capsule Amyot, Ann Berry, NP   benzonatate (TESSALON) 100 MG capsule Take 1 capsule (100 mg total) by mouth 3 (three) times daily as needed for cough. 21 capsule Amyot, Ann Berry, NP     Controlled Substance Prescriptions [place] Controlled Substance Registry consulted? N/A.   Amyot, Ann Berry, NP 11/12/18 2024  

## 2018-11-12 NOTE — Discharge Instructions (Addendum)
Recommend start Tamiflu 75mg  twice a day as directed. May take Tessalon cough pills every 8 hours as needed. Increase fluid intake to help loosen up mucus. May continue Theraflu as directed. Follow-up with your PCP in 3 to 5 days if not improving.

## 2018-11-19 ENCOUNTER — Ambulatory Visit (INDEPENDENT_AMBULATORY_CARE_PROVIDER_SITE_OTHER): Payer: Self-pay | Admitting: Podiatry

## 2018-11-19 DIAGNOSIS — S93402D Sprain of unspecified ligament of left ankle, subsequent encounter: Secondary | ICD-10-CM

## 2018-11-23 NOTE — Progress Notes (Signed)
   HPI: 58 year old female presenting today for follow up evaluation of a left ankle sprain. She states her pain has improved. She has been using the CAM boot and states the pain increases when she does not use it. She has been taking Motrin for treatment. Patient is here for further evaluation and treatment.   Past Medical History:  Diagnosis Date  . Diabetes mellitus without complication ([place])   . Smoking 1/2 pack a day or less   . Smoking 1/2 pack a day or less      Physical Exam: General: The patient is alert and oriented x3 in no acute distress.  Dermatology: Skin is warm, dry and supple bilateral lower extremities. Negative for open lesions or macerations.  Vascular: Palpable pedal pulses bilaterally. No erythema or edema noted. Capillary refill within normal limits.  Neurological: Epicritic and protective threshold grossly intact bilaterally.   Musculoskeletal Exam: Range of motion within normal limits to all pedal and ankle joints bilateral. Muscle strength 5/5 in all groups bilateral.   Assessment: 1. Severe ankle sprain left - resolved    Plan of Care:  1. Patient evaluated.  2. Discontinue using CAM boot.  3. Ankle brace dispensed.  4. Recommended good shoe gear.  5. Return to clinic as needed.   Goes by Kennyth Lose. I did her daughter's surgery.      Edrick Kins, DPM [facility]  Dr. Edrick Kins, DPM    [address], [place] [address]                Office [phone]  Fax [phone]

## 2018-12-12 ENCOUNTER — Other Ambulatory Visit: Payer: Self-pay

## 2018-12-12 ENCOUNTER — Ambulatory Visit (INDEPENDENT_AMBULATORY_CARE_PROVIDER_SITE_OTHER): Payer: BLUE CROSS/BLUE SHIELD | Admitting: Family Medicine

## 2018-12-12 ENCOUNTER — Encounter: Payer: Self-pay | Admitting: Family Medicine

## 2018-12-12 VITALS — BP 102/62 | HR 102 | Temp 98.2°F | Ht 64.0 in | Wt 162.0 lb

## 2018-12-12 DIAGNOSIS — E781 Pure hyperglyceridemia: Secondary | ICD-10-CM

## 2018-12-12 DIAGNOSIS — Z111 Encounter for screening for respiratory tuberculosis: Secondary | ICD-10-CM | POA: Diagnosis not present

## 2018-12-12 DIAGNOSIS — E119 Type 2 diabetes mellitus without complications: Secondary | ICD-10-CM | POA: Diagnosis not present

## 2018-12-12 LAB — POCT GLYCOSYLATED HEMOGLOBIN (HGB A1C): Hemoglobin A1C: 5.6 % (ref 4.0–5.6)

## 2018-12-12 NOTE — Assessment & Plan Note (Signed)
Asymptomatic. Needed for new job. PPD placed. Return in 48-72 hours to read.

## 2018-12-12 NOTE — Patient Instructions (Signed)
Diabetes Mellitus and Foot Care  Foot care is an important part of your health, especially when you have diabetes. Diabetes may cause you to have problems because of poor blood flow (circulation) to your feet and legs, which can cause your skin to:   Become thinner and drier.   Break more easily.   Heal more slowly.   Peel and crack.  You may also have nerve damage (neuropathy) in your legs and feet, causing decreased feeling in them. This means that you may not notice minor injuries to your feet that could lead to more serious problems. Noticing and addressing any potential problems early is the best way to prevent future foot problems.  How to care for your feet  Foot hygiene   Wash your feet daily with warm water and mild soap. Do not use hot water. Then, pat your feet and the areas between your toes until they are completely dry. Do not soak your feet as this can dry your skin.   Trim your toenails straight across. Do not dig under them or around the cuticle. File the edges of your nails with an emery board or nail file.   Apply a moisturizing lotion or petroleum jelly to the skin on your feet and to dry, brittle toenails. Use lotion that does not contain alcohol and is unscented. Do not apply lotion between your toes.  Shoes and socks   Wear clean socks or stockings every day. Make sure they are not too tight. Do not wear knee-high stockings since they may decrease blood flow to your legs.   Wear shoes that fit properly and have enough cushioning. Always look in your shoes before you put them on to be sure there are no objects inside.   To break in new shoes, wear them for just a few hours a day. This prevents injuries on your feet.  Wounds, scrapes, corns, and calluses   Check your feet daily for blisters, cuts, bruises, sores, and redness. If you cannot see the bottom of your feet, use a mirror or ask someone for help.   Do not cut corns or calluses or try to remove them with medicine.   If you  find a minor scrape, cut, or break in the skin on your feet, keep it and the skin around it clean and dry. You may clean these areas with mild soap and water. Do not clean the area with peroxide, alcohol, or iodine.   If you have a wound, scrape, corn, or callus on your foot, look at it several times a day to make sure it is healing and not infected. Check for:  ? Redness, swelling, or pain.  ? Fluid or blood.  ? Warmth.  ? Pus or a bad smell.  General instructions   Do not cross your legs. This may decrease blood flow to your feet.   Do not use heating pads or hot water bottles on your feet. They may burn your skin. If you have lost feeling in your feet or legs, you may not know this is happening until it is too late.   Protect your feet from hot and cold by wearing shoes, such as at the beach or on hot pavement.   Schedule a complete foot exam at least once a year (annually) or more often if you have foot problems. If you have foot problems, report any cuts, sores, or bruises to your health care provider immediately.  Contact a health care provider if:     You have a medical condition that increases your risk of infection and you have any cuts, sores, or bruises on your feet.   You have an injury that is not healing.   You have redness on your legs or feet.   You feel burning or tingling in your legs or feet.   You have pain or cramps in your legs and feet.   Your legs or feet are numb.   Your feet always feel cold.   You have pain around a toenail.  Get help right away if:   You have a wound, scrape, corn, or callus on your foot and:  ? You have pain, swelling, or redness that gets worse.  ? You have fluid or blood coming from the wound, scrape, corn, or callus.  ? Your wound, scrape, corn, or callus feels warm to the touch.  ? You have pus or a bad smell coming from the wound, scrape, corn, or callus.  ? You have a fever.  ? You have a red line going up your leg.  Summary   Check your feet every day  for cuts, sores, red spots, swelling, and blisters.   Moisturize feet and legs daily.   Wear shoes that fit properly and have enough cushioning.   If you have foot problems, report any cuts, sores, or bruises to your health care provider immediately.   Schedule a complete foot exam at least once a year (annually) or more often if you have foot problems.  This information is not intended to replace advice given to you by your health care provider. Make sure you discuss any questions you have with your health care provider.  Document Released: 10/19/2000 Document Revised: 12/04/2017 Document Reviewed: 11/23/2016  Elsevier Interactive Patient Education  2019 Elsevier Inc.

## 2018-12-12 NOTE — Assessment & Plan Note (Signed)
A1C looks good. Continue current regimen.  

## 2018-12-12 NOTE — Progress Notes (Signed)
  Subjective:     Patient ID: Danielle Mccann, female   DOB: Feb 11, 1961, 58 y.o.   MRN: [id number]  HPI IF:OYDXAJOIN with her Metformin 500 mg XL qd. No concern. She is here for f/u. Hypertriglyceridemia: She will like to know when to recheck her test. She is compliant with OTC fish oil. TB screen: She is starting a new job and will like to get her PPD test done today.  Current Outpatient Medications on File Prior to Visit  Medication Sig Dispense Refill  . metFORMIN (GLUCOPHAGE-XR) 500 MG 24 hr tablet TAKE 1 TABLET BY MOUTH EVERY DAY WITH BREAKFAST 90 tablet 2  . Omega-3 Fatty Acids (FISH OIL) 1000 MG CAPS Take 1 capsule (1,000 mg total) by mouth 2 (two) times daily. 180 capsule 0  . glucose blood (FREESTYLE INSULINX TEST) test strip Use to check glucose once daily 100 each 12  . glucose monitoring kit (FREESTYLE) monitoring kit Use to check glucose once daily 1 each 0  . Lancets (FREESTYLE) lancets Use to check glucose once daily 100 each 12   No current facility-administered medications on file prior to visit.    Past Medical History:  Diagnosis Date  . Diabetes mellitus without complication ([place])   . Smoking 1/2 pack a day or less   . Smoking 1/2 pack a day or less    Vitals:   12/12/18 0852  BP: 102/62  Pulse: (!) 102  Temp: 98.2 F (36.8 C)  TempSrc: Oral  SpO2: 98%  Weight: 162 lb (73.5 kg)  Height: '5\' 4"'$  (1.626 m)     Review of Systems  Respiratory: Negative.   Cardiovascular: Negative.   Gastrointestinal: Negative.   Musculoskeletal: Negative.   Neurological: Negative.   All other systems reviewed and are negative.      Objective:   Physical Exam Vitals signs and nursing note reviewed.  Constitutional:      Appearance: Normal appearance.  Cardiovascular:     Rate and Rhythm: Normal rate and regular rhythm.     Pulses: Normal pulses.     Heart sounds: Normal heart sounds. No murmur.  Pulmonary:     Effort: Pulmonary effort is normal. No  respiratory distress.     Breath sounds: Normal breath sounds. No wheezing or rhonchi.  Abdominal:     General: Bowel sounds are normal. There is no distension.     Palpations: Abdomen is soft. There is no mass.     Tenderness: There is no abdominal tenderness.  Musculoskeletal: Normal range of motion.     Comments: Sensory exam of the foot is normal, tested with the monofilament. Good pulses, no lesions or ulcers, good peripheral pulses.   Neurological:     Mental Status: She is alert.        Assessment:     DM2 controlled HTrig TB screening    Plan:     Check problem list

## 2018-12-12 NOTE — Assessment & Plan Note (Signed)
Continue diet control and OTC fish oil. Repeat FLP in 3 months.

## 2018-12-15 ENCOUNTER — Ambulatory Visit (INDEPENDENT_AMBULATORY_CARE_PROVIDER_SITE_OTHER): Payer: BLUE CROSS/BLUE SHIELD | Admitting: *Deleted

## 2018-12-15 DIAGNOSIS — Z111 Encounter for screening for respiratory tuberculosis: Secondary | ICD-10-CM

## 2018-12-15 LAB — TB SKIN TEST
INDURATION: 0 mm
TB Skin Test: NEGATIVE

## 2018-12-15 NOTE — Progress Notes (Signed)
Patient is here for a PPD read.  It was placed on 12/12/18 in the left forearm @ 9:23 am.    PPD RESULTS:  Result: Negative Induration: 0 mm  Letter created and given to patient for documentation purposes. Fleeger, Salome Spotted, CMA

## 2019-01-20 ENCOUNTER — Ambulatory Visit: Payer: Self-pay | Admitting: Podiatry

## 2019-02-11 ENCOUNTER — Other Ambulatory Visit: Payer: Self-pay | Admitting: Family Medicine

## 2019-02-11 DIAGNOSIS — Z1231 Encounter for screening mammogram for malignant neoplasm of breast: Secondary | ICD-10-CM

## 2019-02-24 DIAGNOSIS — E119 Type 2 diabetes mellitus without complications: Secondary | ICD-10-CM | POA: Diagnosis not present

## 2019-02-24 DIAGNOSIS — H40013 Open angle with borderline findings, low risk, bilateral: Secondary | ICD-10-CM | POA: Diagnosis not present

## 2019-03-03 DIAGNOSIS — H40023 Open angle with borderline findings, high risk, bilateral: Secondary | ICD-10-CM | POA: Diagnosis not present

## 2019-03-03 LAB — HM DIABETES EYE EXAM

## 2019-03-18 ENCOUNTER — Other Ambulatory Visit: Payer: Self-pay

## 2019-03-18 ENCOUNTER — Ambulatory Visit (INDEPENDENT_AMBULATORY_CARE_PROVIDER_SITE_OTHER): Payer: BLUE CROSS/BLUE SHIELD | Admitting: Family Medicine

## 2019-03-18 ENCOUNTER — Encounter: Payer: Self-pay | Admitting: Family Medicine

## 2019-03-18 VITALS — BP 110/60 | HR 80 | Ht 64.0 in | Wt 164.0 lb

## 2019-03-18 DIAGNOSIS — E119 Type 2 diabetes mellitus without complications: Secondary | ICD-10-CM | POA: Diagnosis not present

## 2019-03-18 LAB — POCT GLYCOSYLATED HEMOGLOBIN (HGB A1C): HbA1c, POC (controlled diabetic range): 6.4 % (ref 0.0–7.0)

## 2019-03-18 NOTE — Patient Instructions (Signed)
Carbohydrate Counting for Diabetes Mellitus, Adult  Carbohydrate counting is a method of keeping track of how many carbohydrates you eat. Eating carbohydrates naturally increases the amount of sugar (glucose) in the blood. Counting how many carbohydrates you eat helps keep your blood glucose within normal limits, which helps you manage your diabetes (diabetes mellitus). It is important to know how many carbohydrates you can safely have in each meal. This is different for every person. A diet and nutrition specialist (registered dietitian) can help you make a meal plan and calculate how many carbohydrates you should have at each meal and snack. Carbohydrates are found in the following foods:  Grains, such as breads and cereals.  Dried beans and soy products.  Starchy vegetables, such as potatoes, peas, and corn.  Fruit and fruit juices.  Milk and yogurt.  Sweets and snack foods, such as cake, cookies, candy, chips, and soft drinks. How do I count carbohydrates? There are two ways to count carbohydrates in food. You can use either of the methods or a combination of both. Reading "Nutrition Facts" on packaged food The "Nutrition Facts" list is included on the labels of almost all packaged foods and beverages in the [place] It includes:  The serving size.  Information about nutrients in each serving, including the grams (g) of carbohydrate per serving. To use the "Nutrition Facts":  Decide how many servings you will have.  Multiply the number of servings by the number of carbohydrates per serving.  The resulting number is the total amount of carbohydrates that you will be having. Learning standard serving sizes of other foods When you eat carbohydrate foods that are not packaged or do not include "Nutrition Facts" on the label, you need to measure the servings in order to count the amount of carbohydrates:  Measure the foods that you will eat with a food scale or measuring cup, if needed.   Decide how many standard-size servings you will eat.  Multiply the number of servings by 15. Most carbohydrate-rich foods have about 15 g of carbohydrates per serving. ? For example, if you eat 8 oz (170 g) of strawberries, you will have eaten 2 servings and 30 g of carbohydrates (2 servings x 15 g = 30 g).  For foods that have more than one food mixed, such as soups and casseroles, you must count the carbohydrates in each food that is included. The following list contains standard serving sizes of common carbohydrate-rich foods. Each of these servings has about 15 g of carbohydrates:   hamburger bun or  English muffin.   oz (15 mL) syrup.   oz (14 g) jelly.  1 slice of bread.  1 six-inch tortilla.  3 oz (85 g) cooked rice or pasta.  4 oz (113 g) cooked dried beans.  4 oz (113 g) starchy vegetable, such as peas, corn, or potatoes.  4 oz (113 g) hot cereal.  4 oz (113 g) mashed potatoes or  of a large baked potato.  4 oz (113 g) canned or frozen fruit.  4 oz (120 mL) fruit juice.  4-6 crackers.  6 chicken nuggets.  6 oz (170 g) unsweetened dry cereal.  6 oz (170 g) plain fat-free yogurt or yogurt sweetened with artificial sweeteners.  8 oz (240 mL) milk.  8 oz (170 g) fresh fruit or one small piece of fruit.  24 oz (680 g) popped popcorn. Example of carbohydrate counting Sample meal  3 oz (85 g) chicken breast.  6 oz (170 g)   brown rice.  4 oz (113 g) corn.  8 oz (240 mL) milk.  8 oz (170 g) strawberries with sugar-free whipped topping. Carbohydrate calculation 1. Identify the foods that contain carbohydrates: ? Rice. ? Corn. ? Milk. ? Strawberries. 2. Calculate how many servings you have of each food: ? 2 servings rice. ? 1 serving corn. ? 1 serving milk. ? 1 serving strawberries. 3. Multiply each number of servings by 15 g: ? 2 servings rice x 15 g = 30 g. ? 1 serving corn x 15 g = 15 g. ? 1 serving milk x 15 g = 15 g. ? 1 serving  strawberries x 15 g = 15 g. 4. Add together all of the amounts to find the total grams of carbohydrates eaten: ? 30 g + 15 g + 15 g + 15 g = 75 g of carbohydrates total. Summary  Carbohydrate counting is a method of keeping track of how many carbohydrates you eat.  Eating carbohydrates naturally increases the amount of sugar (glucose) in the blood.  Counting how many carbohydrates you eat helps keep your blood glucose within normal limits, which helps you manage your diabetes.  A diet and nutrition specialist (registered dietitian) can help you make a meal plan and calculate how many carbohydrates you should have at each meal and snack. This information is not intended to replace advice given to you by your health care provider. Make sure you discuss any questions you have with your health care provider. Document Released: 10/22/2005 Document Revised: 05/01/2017 Document Reviewed: 04/04/2016 Elsevier Interactive Patient Education  2019 Elsevier Inc.  

## 2019-03-18 NOTE — Progress Notes (Signed)
  Subjective:     Patient ID: Danielle Mccann, female   DOB: Jun 15, 1961, 58 y.o.   MRN: [id number]  HPI DM2: Here for f/u. She is compliant with her Metformin '500mg'$  XR qd. She has not been getting a lot of exercise due to COVID-19. Her home CBG this morning was 145. Eye exam 2 weeks ago was normal.  Current Outpatient Medications on File Prior to Visit  Medication Sig Dispense Refill  . metFORMIN (GLUCOPHAGE-XR) 500 MG 24 hr tablet TAKE 1 TABLET BY MOUTH EVERY DAY WITH BREAKFAST 90 tablet 2  . Omega-3 Fatty Acids (FISH OIL) 1000 MG CAPS Take 1 capsule (1,000 mg total) by mouth 2 (two) times daily. 180 capsule 0  . glucose blood (FREESTYLE INSULINX TEST) test strip Use to check glucose once daily 100 each 12  . glucose monitoring kit (FREESTYLE) monitoring kit Use to check glucose once daily 1 each 0  . Lancets (FREESTYLE) lancets Use to check glucose once daily 100 each 12   No current facility-administered medications on file prior to visit.    Past Medical History:  Diagnosis Date  . Diabetes mellitus without complication ([place])   . Smoking 1/2 pack a day or less   . Smoking 1/2 pack a day or less      Review of Systems  Respiratory: Negative.   Cardiovascular: Negative.   Gastrointestinal: Negative.   Neurological: Negative.   All other systems reviewed and are negative.      Objective:   Physical Exam Vitals signs and nursing note reviewed.  Constitutional:      Appearance: She is not ill-appearing or diaphoretic.  Cardiovascular:     Rate and Rhythm: Normal rate and regular rhythm.     Pulses: Normal pulses.     Heart sounds: Normal heart sounds. No murmur.  Pulmonary:     Effort: Pulmonary effort is normal. No respiratory distress.     Breath sounds: Normal breath sounds. No wheezing.  Abdominal:     General: Abdomen is flat. Bowel sounds are normal.  Neurological:     Mental Status: She is alert.        Assessment:     DM2    Plan:     Check  problem list

## 2019-03-18 NOTE — Assessment & Plan Note (Signed)
Compliant with her meds but not with diet and exercise. A1C worsened.  I encourage 30 min walk 3-5 times a day. Continue current dose of Metformin. Recheck A1C in 3 months. Obtain ophthalmology report for [place].

## 2019-04-07 ENCOUNTER — Encounter: Payer: Self-pay | Admitting: Family Medicine

## 2019-04-08 ENCOUNTER — Encounter: Payer: Self-pay | Admitting: Family Medicine

## 2019-04-13 ENCOUNTER — Encounter: Payer: Self-pay | Admitting: Family Medicine

## 2019-04-13 ENCOUNTER — Other Ambulatory Visit: Payer: Self-pay | Admitting: Family Medicine

## 2019-04-17 ENCOUNTER — Ambulatory Visit
Admission: RE | Admit: 2019-04-17 | Discharge: 2019-04-17 | Disposition: A | Discharge status: Home / Self Care | Payer: BC Managed Care – PPO | Source: Ambulatory Visit | Attending: Family Medicine | Admitting: Family Medicine

## 2019-04-17 ENCOUNTER — Other Ambulatory Visit: Payer: Self-pay

## 2019-04-17 DIAGNOSIS — Z1231 Encounter for screening mammogram for malignant neoplasm of breast: Secondary | ICD-10-CM | POA: Diagnosis not present

## 2019-04-20 ENCOUNTER — Encounter: Payer: Self-pay | Admitting: Family Medicine

## 2019-04-21 ENCOUNTER — Other Ambulatory Visit: Payer: Self-pay

## 2019-04-21 ENCOUNTER — Encounter: Payer: Self-pay | Admitting: Family Medicine

## 2019-04-21 ENCOUNTER — Ambulatory Visit (INDEPENDENT_AMBULATORY_CARE_PROVIDER_SITE_OTHER): Payer: BC Managed Care – PPO | Admitting: Family Medicine

## 2019-04-21 VITALS — BP 118/60 | HR 70 | Ht 64.0 in | Wt 164.4 lb

## 2019-04-21 DIAGNOSIS — E119 Type 2 diabetes mellitus without complications: Secondary | ICD-10-CM

## 2019-04-21 DIAGNOSIS — E781 Pure hyperglyceridemia: Secondary | ICD-10-CM | POA: Diagnosis not present

## 2019-04-21 DIAGNOSIS — L659 Nonscarring hair loss, unspecified: Secondary | ICD-10-CM

## 2019-04-21 MED ORDER — ATORVASTATIN CALCIUM 10 MG PO TABS: 10.0000 mg | ORAL_TABLET | ORAL | 1 refills | Status: DC

## 2019-04-21 NOTE — Assessment & Plan Note (Signed)
Continue fish oil. Add Statin to regimen. Atorvastatin 10 mg twice weekly since her LDL is not too elevated. Recheck FLP in few months.

## 2019-04-21 NOTE — Assessment & Plan Note (Signed)
Stable on current regimen   

## 2019-04-21 NOTE — Patient Instructions (Signed)
Alopecia Areata, Adult  Alopecia areata is a condition that causes you to lose hair. You may lose hair on your scalp in patches. In some cases, you may lose all the hair on your scalp (alopecia totalis) or all the hair from your face and body (alopecia universalis). Alopecia areata is an autoimmune disease. This means that your body's defense system (immune system) mistakes normal parts of the body for germs or other things that can make you sick. When you have alopecia areata, the immune system attacks the hair follicles. Alopecia areata usually develops in childhood, but it can develop at any age. For some people, their hair grows back on its own and hair loss does not happen again. For others, their hair may fall out and grow back in cycles. The hair loss may last many years. Having this condition can be emotionally difficult, but it is not dangerous. What are the causes? The cause of this condition is not known. What increases the risk? This condition is more likely to develop in people who have:  A family history of alopecia.  A family history of another autoimmune disease, including type 1 diabetes and rheumatoid arthritis.  Asthma and allergies.  Down syndrome. What are the signs or symptoms? Round spots of patchy hair loss on the scalp is the main symptom of this condition. The spots may be mildly itchy. Other symptoms include:  Short dark hairs in the bald patches that are wider at the top (exclamation point hairs).  Dents, white spots, or lines in the fingernails or toenails.  Balding and body hair loss. This is rare. How is this diagnosed? This condition is diagnosed based on your symptoms and family history. Your health care provider will also check your scalp skin, teeth, and nails. Your health care provider may refer you to a specialist in hair and skin disorders (dermatologist). You may also have tests, including:  A hair pull test.  Blood tests or other screening tests  to check for autoimmune diseases, such as thyroid disease or diabetes.  Skin biopsy to confirm the diagnosis.  A procedure to examine the skin with a lighted magnifying instrument (dermoscopy). How is this treated? There is no cure for alopecia areata. Treatment is aimed at promoting the regrowth of hair and preventing the immune system from overreacting. No single treatment is right for all people with alopecia areata. It depends on the type of hair loss you have and how severe it is. Work with your health care provider to find the best treatment for you. Treatment may include:  Having regular checkups to make sure the condition is not getting worse (watchful waiting).  Steroid creams or pills for 6-8 weeks to stop the immune reaction and help hair to regrow more quickly.  Other topical medicines to alter the immune system response and support the hair growth cycle.  Steroid injections.  Therapy and counseling with a support group or therapist if you are having trouble coping with hair loss. Follow these instructions at home:  Learn as much as you can about your condition.  Apply topical creams only as told by your health care provider.  Take over-the-counter and prescription medicines only as told by your health care provider.  Consider getting a wig or products to make hair look fuller or to cover bald spots, if you feel uncomfortable with your appearance.  Get therapy or counseling if you are having a hard time coping with hair loss. Ask your health care provider to recommend  or to cover bald spots, if you feel uncomfortable with your appearance.   Get therapy or counseling if you are having a hard time coping with hair loss. Ask your health care provider to recommend a counselor or support group.   Keep all follow-up visits as told by your health care provider. This is important.  Contact a health care provider if:   Your hair loss gets worse, even with treatment.   You have new symptoms.   You are struggling emotionally.  Summary   Alopecia areata is an autoimmune condition that makes your body's defense system (immune system) attack the hair follicles. This causes you  to lose hair.   Treatments may include regular checkups to make sure that the condition is not getting worse (watchful waiting), medicines, and steroid injections.  This information is not intended to replace advice given to you by your health care provider. Make sure you discuss any questions you have with your health care provider.  Document Released: 05/26/2004 Document Revised: 11/09/2016 Document Reviewed: 11/09/2016  Elsevier Interactive Patient Education  2019 Elsevier Inc.

## 2019-04-21 NOTE — Assessment & Plan Note (Signed)
??   Female pattern baldism Vs. Stress induced Vs. Chemical damage to her hair follicles. Hair loss not very obvious or mild on exam today. Avoid use of chemicals on hair. Use baby oil. Stress reduction discussed. Derm referral vs. watching discussed. She prefers to wait since there has been improvement in the last few weeks. Monitor closely for improvement.

## 2019-04-21 NOTE — Progress Notes (Signed)
  Subjective:     Patient ID: Danielle Mccann, female   DOB: [birth year], 58 y.o.   MRN: [id number]  HPI  Hair loss: C/O losing hair at the center of her hair which started about a month ago since the COVID-19 outbreak. She denies any scalp pain, there is associated itchiness. She uses chemicals on her hair to perm it. In the last few weeks she noticed that the hair is growing back after using wild growth cream. No other concern. DM2: Compliant with her Metformin and CBG check. She contacted her pharmacy about the recall of Metformin ER product and she was informed that her medication is not on the recall list. Hypertriglyceridemia: compliant with fish oil.  Current Outpatient Medications on File Prior to Visit  Medication Sig Dispense Refill  . metFORMIN (GLUCOPHAGE-XR) 500 MG 24 hr tablet TAKE 1 TABLET BY MOUTH ONCE DAILY WITH BREAKFAST 90 tablet 0  . Omega-3 Fatty Acids (FISH OIL) 1000 MG CAPS Take 1 capsule (1,000 mg total) by mouth 2 (two) times daily. 180 capsule 0  . glucose blood (FREESTYLE INSULINX TEST) test strip Use to check glucose once daily 100 each 12  . glucose monitoring kit (FREESTYLE) monitoring kit Use to check glucose once daily 1 each 0  . Lancets (FREESTYLE) lancets Use to check glucose once daily 100 each 12   No current facility-administered medications on file prior to visit.    Past Medical History:  Diagnosis Date  . Diabetes mellitus without complication ([place])   . Smoking 1/2 pack a day or less   . Smoking 1/2 pack a day or less    Vitals:   04/21/19 0939  BP: 118/60  Pulse: 70  SpO2: 97%  Weight: 164 lb 6 oz (74.6 kg)  Height: '5\' 4"'$  (1.626 m)     Review of Systems     Objective:   Physical Exam Vitals signs and nursing note reviewed.  Constitutional:      Appearance: She is not ill-appearing.  HENT:     Head:     Comments: Small, area with hair loss at the center of her hair, barely noticeable. Cardiovascular:     Rate and Rhythm:  Normal rate and regular rhythm.     Heart sounds: Normal heart sounds. No murmur.  Pulmonary:     Effort: Pulmonary effort is normal. No respiratory distress.     Breath sounds: Normal breath sounds. No wheezing.  Abdominal:     General: Bowel sounds are normal.     Palpations: Abdomen is soft. There is no mass.     Tenderness: There is no abdominal tenderness.  Musculoskeletal:     Right lower leg: No edema.     Left lower leg: No edema.  Neurological:     Mental Status: She is alert and oriented to person, place, and time.        Assessment:     Alopecia DM2 Hypertriglyceridemia    Plan:     Check problem list.

## 2019-06-20 ENCOUNTER — Encounter: Payer: Self-pay | Admitting: Family Medicine

## 2019-06-22 ENCOUNTER — Telehealth: Payer: Self-pay

## 2019-06-22 NOTE — Telephone Encounter (Signed)
Spoke with pt. Made virtual appt for 06/23/2019 at 8:50 with [place]. Salvatore Marvel, CMA

## 2019-06-23 ENCOUNTER — Other Ambulatory Visit: Payer: Self-pay

## 2019-06-23 ENCOUNTER — Telehealth (INDEPENDENT_AMBULATORY_CARE_PROVIDER_SITE_OTHER): Payer: BC Managed Care – PPO | Admitting: Family Medicine

## 2019-06-23 DIAGNOSIS — K5904 Chronic idiopathic constipation: Secondary | ICD-10-CM | POA: Diagnosis not present

## 2019-06-23 DIAGNOSIS — K5900 Constipation, unspecified: Secondary | ICD-10-CM | POA: Diagnosis not present

## 2019-06-23 NOTE — Assessment & Plan Note (Signed)
Patient most likely experiencing constipation due to poor fiber and water intake. Also eats a lot of fast food so poor dietary habits most likely contributing. Since this has been a chronic issue I will start her on Metamucil and Miralax. - Metamucil once per day  - Miralax once per day for the first 5-10 days, if bowel movements are not regular and soft increase to twice a day.  - F/u if no improvement

## 2019-06-23 NOTE — Progress Notes (Signed)
[facility] Telemedicine Visit  Patient consented to have virtual visit. Method of visit: Video  Encounter participants: Patient: Danielle Mccann - located at home in [facility] Provider: Nuala Alpha - located at [facility] Others (if applicable): none  Chief Complaint: Constipation  HPI:  Patient states she has had long term constipation. She has infrequent bowel movements and does not go with any regularity. When she does have a bowel movement she states it is usually hard to pass and the stool appears very hard. She denies pain on bowel movements or any blood in the toilet or on the tissue after bowel movements. She does admit to having "terrible eating habits". She does not eat a lot of vegetables. She eats a lot of fast food. She drinks sodas as well. She does not exercise. She only tried taking an herbal tea for her symptoms and states it had no effect.  She denies fever, chills, abdominal pain, nausea, vomiting, or diarrhea.  ROS: per HPI  Pertinent PMHx: T2DM, Overweight, HLD  Exam:  Gen: NAD Respiratory: speaking in full sentences, no signs of respiratory distress Skin: no rashes  Assessment/Plan:  Constipation Patient most likely experiencing constipation due to poor fiber and water intake. Also eats a lot of fast food so poor dietary habits most likely contributing. Since this has been a chronic issue I will start her on Metamucil and Miralax. - Metamucil once per day  - Miralax once per day for the first 5-10 days, if bowel movements are not regular and soft increase to twice a day.  - F/u if no improvement    Time spent during visit with patient: >10 minutes  Harolyn Rutherford, [place], PGY-3

## 2019-07-25 ENCOUNTER — Other Ambulatory Visit: Payer: Self-pay | Admitting: Family Medicine

## 2019-08-28 ENCOUNTER — Ambulatory Visit (INDEPENDENT_AMBULATORY_CARE_PROVIDER_SITE_OTHER): Payer: BC Managed Care – PPO | Admitting: Family Medicine

## 2019-08-28 ENCOUNTER — Encounter: Payer: Self-pay | Admitting: Family Medicine

## 2019-08-28 ENCOUNTER — Other Ambulatory Visit: Payer: Self-pay

## 2019-08-28 ENCOUNTER — Other Ambulatory Visit (HOSPITAL_COMMUNITY)
Admission: RE | Admit: 2019-08-28 | Discharge: 2019-08-28 | Disposition: A | Discharge status: Home / Self Care | Payer: BC Managed Care – PPO | Source: Ambulatory Visit | Attending: Family Medicine | Admitting: Family Medicine

## 2019-08-28 ENCOUNTER — Other Ambulatory Visit: Payer: Self-pay | Admitting: Family Medicine

## 2019-08-28 VITALS — BP 105/58 | HR 75 | Temp 98.8°F | Ht 64.0 in | Wt 160.0 lb

## 2019-08-28 DIAGNOSIS — E785 Hyperlipidemia, unspecified: Secondary | ICD-10-CM

## 2019-08-28 DIAGNOSIS — Z124 Encounter for screening for malignant neoplasm of cervix: Secondary | ICD-10-CM | POA: Insufficient documentation

## 2019-08-28 DIAGNOSIS — E119 Type 2 diabetes mellitus without complications: Secondary | ICD-10-CM

## 2019-08-28 DIAGNOSIS — Z0000 Encounter for general adult medical examination without abnormal findings: Secondary | ICD-10-CM | POA: Diagnosis not present

## 2019-08-28 DIAGNOSIS — N898 Other specified noninflammatory disorders of vagina: Secondary | ICD-10-CM | POA: Diagnosis not present

## 2019-08-28 DIAGNOSIS — E781 Pure hyperglyceridemia: Secondary | ICD-10-CM

## 2019-08-28 DIAGNOSIS — E1169 Type 2 diabetes mellitus with other specified complication: Secondary | ICD-10-CM

## 2019-08-28 LAB — POCT GLYCOSYLATED HEMOGLOBIN (HGB A1C): HbA1c, POC (controlled diabetic range): 6.2 % (ref 0.0–7.0)

## 2019-08-28 LAB — POCT WET PREP (WET MOUNT)
Clue Cells Wet Prep Whiff POC: NEGATIVE
Trichomonas Wet Prep HPF POC: ABSENT

## 2019-08-28 MED ORDER — FLUCONAZOLE 150 MG PO TABS: 150.0000 mg | ORAL_TABLET | Freq: Once | ORAL | 0 refills | Status: AC

## 2019-08-28 NOTE — Assessment & Plan Note (Addendum)
She is not fasting today. Return for lab.

## 2019-08-28 NOTE — Assessment & Plan Note (Addendum)
A1C looks good on Metformin XR 1 tab qd. Continue same. Cmet checked.

## 2019-08-28 NOTE — Patient Instructions (Signed)
Preventing Influenza, Adult Influenza, more commonly known as "the flu," is a viral infection that mainly affects the respiratory tract. The respiratory tract includes structures that help you breathe, such as the lungs, nose, and throat. The flu causes many common cold symptoms, as well as a high fever and body aches. The flu spreads easily from person to person (is contagious). The flu is most common from December through March. This is called flu season.You can catch the flu virus by:  Breathing in droplets from an infected person's cough or sneeze.  Touching something that was recently contaminated with the virus and then touching your mouth, nose, or eyes. What can I do to lower my risk?        You can decrease your risk of getting the flu by:  Getting a flu shot (influenza vaccination) every year. This is the best way to prevent the flu. A flu shot is recommended for everyone age 6 months and older. ? It is best to get a flu shot in the fall, as soon as it is available. Getting a flu shot during winter or spring instead is still a good idea. Flu season can last into early spring. ? Preventing the flu through vaccination requires getting a new flu shot every year. This is because the flu virus changes slightly (mutates) from one year to the next. Even if a flu shot does not completely protect you from all flu virus mutations, it can reduce the severity of your illness and prevent dangerous complications of the flu. ? If you are pregnant, you can and should get a flu shot. ? If you have had a reaction to the shot in the past or if you are allergic to eggs, check with your health care provider before getting a flu shot. ? Sometimes the vaccine is available as a nasal spray. In some years, the nasal spray has not been as effective against the flu virus. Check with your health care provider if you have questions about this.  Practicing good health habits. This is especially important during  flu season. ? Avoid contact with people who are sick with flu or cold symptoms. ? Wash your hands with soap and water often. If soap and water are not available, use alcohol-based hand sanitizer. ? Avoid touching your hands to your face, especially when you have not washed your hands recently. ? Use a disinfectant to clean surfaces at home and at work that may be contaminated with the flu virus. ? Keep your body's disease-fighting system (immune system) in good shape by eating a healthy diet, drinking plenty of fluids, getting enough sleep, and exercising regularly. If you do get the flu, avoid spreading it to others by:  Staying home until your symptoms have been gone for at least one day.  Covering your mouth and nose when you cough or sneeze.  Avoiding close contact with others, especially babies and elderly people. Why are these changes important? Getting a flu shot and practicing good health habits protects you as well as other people. If you get the flu, your friends, family, and co-workers are also at risk of getting it, because it spreads so easily to others. Each year, about 2 out of every 10 people get the flu. Having the flu can lead to complications, such as pneumonia, ear infection, and sinus infection. The flu also can be deadly, especially for babies, people older than age 65, and people who have serious long-term diseases. How is this treated? Most   people recover from the flu by resting at home and drinking plenty of fluids. However, a prescription antiviral medicine may reduce your flu symptoms and may make your flu go away sooner. This medicine must be started within a few days of getting flu symptoms. You can talk with your health care provider about whether you need an antiviral medicine. Antiviral medicine may be prescribed for people who are at risk for more serious flu symptoms. This includes people who:  Are older than age 65.  Are pregnant.  Have a condition that  makes the flu worse or more dangerous. Where to find more information  Centers for Disease Control and Prevention: www.cdc.gov/flu/index.htm  Flu.gov: www.flu.gov/prevention-vaccination  American Academy of Family Physicians: familydoctor.org/familydoctor/en/kids/vaccines/preventing-the-flu.html Contact a health care provider if:  You have influenza and you develop new symptoms.  You have: ? Chest pain. ? Diarrhea. ? A fever.  Your cough gets worse, or you produce more mucus. Summary  The best way to prevent the flu is to get a flu shot every year in the fall.  Even if you get the flu after you have received the yearly vaccine, your flu may be milder and go away sooner because of your flu shot.  If you get the flu, antiviral medicines that are started with a few days of symptoms may reduce your flu symptoms and may make your flu go away sooner.  You can also help prevent the flu by practicing good health habits. This information is not intended to replace advice given to you by your health care provider. Make sure you discuss any questions you have with your health care provider. Document Released: 11/06/2015 Document Revised: 10/04/2017 Document Reviewed: 06/30/2016 Elsevier Patient Education  2020 Elsevier Inc.  

## 2019-08-28 NOTE — Progress Notes (Signed)
Patient ID: Danielle Mccann, female   DOB: December 09, 1960, 58 y.o.   MRN: [id number] Subjective:     Danielle Mccann is a 58 y.o. female and is here for a comprehensive physical exam. The patient reports no problems.  Social History   Socioeconomic History  . Marital status: Single    Spouse name: Not on file  . Number of children: Not on file  . Years of education: Not on file  . Highest education level: Not on file  Occupational History  . Not on file  Social Needs  . Financial resource strain: Not on file  . Food insecurity    Worry: Not on file    Inability: Not on file  . Transportation needs    Medical: Not on file    Non-medical: Not on file  Tobacco Use  . Smoking status: Former Smoker    Packs/day: 0.50    Types: Cigarettes    Quit date: 11/28/2018    Years since quitting: 0.7  . Smokeless tobacco: Never Used  Substance and Sexual Activity  . Alcohol use: No    Alcohol/week: 0.0 standard drinks  . Drug use: No    Types: Marijuana  . Sexual activity: Not Currently    Comment: quit 11 years ago  Lifestyle  . Physical activity    Days per week: Not on file    Minutes per session: Not on file  . Stress: Not on file  Relationships  . Social Herbalist on phone: Not on file    Gets together: Not on file    Attends religious service: Not on file    Active member of club or organization: Not on file    Attends meetings of clubs or organizations: Not on file    Relationship status: Not on file  . Intimate partner violence    Fear of current or ex partner: Not on file    Emotionally abused: Not on file    Physically abused: Not on file    Forced sexual activity: Not on file  Other Topics Concern  . Not on file  Social History Narrative   Lives with teenage children Ages 71 & 109.  Originally from [place]- brother ans sister here.  Works at [employer] as a Engineer, manufacturing systems.     Health Maintenance  Topic Date Due  .  INFLUENZA VACCINE  06/06/2019  . PAP SMEAR-Modifier  07/21/2019  . URINE MICROALBUMIN  08/16/2019  . HEMOGLOBIN A1C  09/18/2019  . FOOT EXAM  12/13/2019  . OPHTHALMOLOGY EXAM  03/02/2020  . MAMMOGRAM  04/16/2021  . COLONOSCOPY  07/23/2023  . TETANUS/TDAP  11/07/2025  . PNEUMOCOCCAL POLYSACCHARIDE VACCINE AGE 36-64 HIGH RISK  Completed  . Hepatitis C Screening  Completed  . HIV Screening  Completed    The following portions of the patient's history were reviewed and updated as appropriate: allergies, current medications, past family history, past medical history, past social history, past surgical history and problem list.  Review of Systems Pertinent items noted in HPI and remainder of comprehensive ROS otherwise negative.   Objective:    BP (!) 105/58   Pulse 75   Temp 98.8 F (37.1 C) (Oral)   Ht 5\' 4"  (1.626 m)   Wt 160 lb (72.6 kg)   SpO2 97%   BMI 27.46 kg/m  General appearance: alert and cooperative Head: Normocephalic, without obvious abnormality, atraumatic Eyes: conjunctivae/corneas clear. PERRL, EOM's intact. Fundi benign.  Ears: normal TM's and external ear canals both ears Throat: lips, mucosa, and tongue normal; teeth and gums normal Lungs: clear to auscultation bilaterally Heart: regular rate and rhythm, S1, S2 normal, no murmur, click, rub or gallop Abdomen: soft, non-tender; bowel sounds normal; no masses,  no organomegaly Pelvic: cervix normal in appearance, external genitalia normal, no adnexal masses or tenderness, no cervical motion tenderness, rectovaginal septum normal, uterus normal size, shape, and consistency, vagina normal without discharge and + thick vaginal discharge Extremities: extremities normal, atraumatic, no cyanosis or edema Pulses: 2+ and symmetric Skin: Skin color, texture, turgor normal. No rashes or lesions Neurologic: Alert and oriented X 3, normal strength and tone. Normal symmetric reflexes. Normal coordination and gait     Assessment:    Healthy female exam.   DM2 HLD Vaginitis Plan:   Normal physical exam. Wet prep completed for vaginitis, she declined STD screen. + Candida vaginitis per wet prep result. I called patient after visit to discuss result and need for treatment. Diflucan 150 mg x 1 dose escribed. PAP completed. I will contact her with the result. A1C looks good on her DM regimen. Continue same.

## 2019-08-29 LAB — MICROALBUMIN / CREATININE URINE RATIO
Creatinine, Urine: 89.5 mg/dL
Microalb/Creat Ratio: 9 mg/g creat (ref 0–29)
Microalbumin, Urine: 8 ug/mL

## 2019-08-30 ENCOUNTER — Encounter: Payer: Self-pay | Admitting: Family Medicine

## 2019-09-01 LAB — CYTOLOGY - PAP
Comment: NEGATIVE
Diagnosis: NEGATIVE
High risk HPV: NEGATIVE

## 2019-09-03 DIAGNOSIS — H40023 Open angle with borderline findings, high risk, bilateral: Secondary | ICD-10-CM | POA: Diagnosis not present

## 2019-09-04 ENCOUNTER — Other Ambulatory Visit: Payer: BC Managed Care – PPO

## 2019-09-04 ENCOUNTER — Other Ambulatory Visit: Payer: Self-pay

## 2019-09-04 DIAGNOSIS — E119 Type 2 diabetes mellitus without complications: Secondary | ICD-10-CM | POA: Diagnosis not present

## 2019-09-04 DIAGNOSIS — E781 Pure hyperglyceridemia: Secondary | ICD-10-CM | POA: Diagnosis not present

## 2019-09-05 LAB — LIPID PANEL
Chol/HDL Ratio: 2.2 ratio (ref 0.0–4.4)
Cholesterol, Total: 91 mg/dL — ABNORMAL LOW (ref 100–199)
HDL: 41 mg/dL (ref >39)
LDL Chol Calc (NIH): 34 mg/dL (ref 0–99)
Triglycerides: 75 mg/dL (ref 0–149)
VLDL Cholesterol Cal: 16 mg/dL (ref 5–40)

## 2019-09-05 LAB — CMP14+EGFR
ALT: 17 IU/L (ref 0–32)
AST: 11 IU/L (ref 0–40)
Albumin/Globulin Ratio: 2.1 (ref 1.2–2.2)
Albumin: 4.6 g/dL (ref 3.8–4.9)
Alkaline Phosphatase: 76 IU/L (ref 39–117)
BUN/Creatinine Ratio: 19 (ref 9–23)
BUN: 14 mg/dL (ref 6–24)
Bilirubin Total: 0.8 mg/dL (ref 0.0–1.2)
CO2: 23 mmol/L (ref 20–29)
Calcium: 9 mg/dL (ref 8.7–10.2)
Chloride: 104 mmol/L (ref 96–106)
Creatinine, Ser: 0.73 mg/dL (ref 0.57–1.00)
GFR calc Af Amer: 105 mL/min/{1.73_m2} (ref >59)
GFR calc non Af Amer: 91 mL/min/{1.73_m2} (ref >59)
Globulin, Total: 2.2 g/dL (ref 1.5–4.5)
Glucose: 140 mg/dL — ABNORMAL HIGH (ref 65–99)
Potassium: 4.2 mmol/L (ref 3.5–5.2)
Sodium: 139 mmol/L (ref 134–144)
Total Protein: 6.8 g/dL (ref 6.0–8.5)

## 2019-09-07 ENCOUNTER — Telehealth: Payer: Self-pay | Admitting: Family Medicine

## 2019-09-07 NOTE — Telephone Encounter (Signed)
Lab result discussed with her.

## 2019-09-30 ENCOUNTER — Other Ambulatory Visit: Payer: Self-pay | Admitting: Family Medicine

## 2019-11-18 DIAGNOSIS — Z20828 Contact with and (suspected) exposure to other viral communicable diseases: Secondary | ICD-10-CM | POA: Diagnosis not present

## 2019-12-18 ENCOUNTER — Ambulatory Visit: Payer: BC Managed Care – PPO | Admitting: Family Medicine

## 2019-12-18 ENCOUNTER — Ambulatory Visit (INDEPENDENT_AMBULATORY_CARE_PROVIDER_SITE_OTHER): Payer: BC Managed Care – PPO | Admitting: Family Medicine

## 2019-12-18 ENCOUNTER — Other Ambulatory Visit: Payer: Self-pay

## 2019-12-18 ENCOUNTER — Encounter: Payer: Self-pay | Admitting: Family Medicine

## 2019-12-18 VITALS — BP 124/60 | HR 72 | Wt 162.0 lb

## 2019-12-18 DIAGNOSIS — N63 Unspecified lump in unspecified breast: Secondary | ICD-10-CM | POA: Insufficient documentation

## 2019-12-18 DIAGNOSIS — E119 Type 2 diabetes mellitus without complications: Secondary | ICD-10-CM

## 2019-12-18 HISTORY — DX: Unspecified lump in unspecified breast: N63.0

## 2019-12-18 LAB — POCT GLYCOSYLATED HEMOGLOBIN (HGB A1C): HbA1c, POC (controlled diabetic range): 6.4 % (ref 0.0–7.0)

## 2019-12-18 NOTE — Assessment & Plan Note (Signed)
Mammogram from 04/2019 shows no evidence of malignancy does note dense breast tissue.  No previous history of breast lumps, breast cancer.  No known breast cancer in the family.  The differential certainly includes breast cancer but benign etiologies are also possible. -Follow-up diagnostic mammography -Follow-up breast ultrasound -We will consider needle biopsy pending the above results

## 2019-12-18 NOTE — Progress Notes (Signed)
   CHIEF COMPLAINT / HPI:  Breast lump Danielle Mccann noticed mild breast tenderness and a breast lump about 3 days ago.  She found her left upper breast to be a little bit tender and noticed a small knot when she palpated it.  Initially, she tried calling the breast center to schedule an appointment for work-up and was told she first need to be seen by a physician.  She does not remember any trauma to her left upper chest, no previous issues with breast infections.  She has not noted any skin changes.  She has no personal or family history of breast cancer.  She has not previously been treated with estrogen replacement therapy.  PERTINENT  PMH / PSH: No known history of breast lumps, breast cancer.  Dense breasts noted on mammography previously.   OBJECTIVE: BP 124/60   Pulse 72   Wt 162 lb (73.5 kg)   SpO2 99%   BMI 27.81 kg/m    Breast exam: On gross exam, left breast slightly enlarged compared to right breast.  No notable skin changes.  No nipple changes.  No axillary lymphadenopathy bilaterally.  Left breast exam was notable for a 0.5 cm nodule, mildly tender to palpation, immobile found in the superior, lateral quadrant.  ASSESSMENT / PLAN:  Breast lump Mammogram from 04/2019 shows no evidence of malignancy does note dense breast tissue.  No previous history of breast lumps, breast cancer.  No known breast cancer in the family.  The differential certainly includes breast cancer but benign etiologies are also possible. -Follow-up diagnostic mammography -Follow-up breast ultrasound -We will consider needle biopsy pending the above results     Matilde Haymaker, MD [place]

## 2019-12-21 ENCOUNTER — Other Ambulatory Visit: Payer: Self-pay | Admitting: Family Medicine

## 2019-12-21 ENCOUNTER — Ambulatory Visit: Payer: BC Managed Care – PPO

## 2019-12-21 ENCOUNTER — Ambulatory Visit
Admission: RE | Admit: 2019-12-21 | Discharge: 2019-12-21 | Disposition: A | Discharge status: Home / Self Care | Payer: BC Managed Care – PPO | Source: Ambulatory Visit | Attending: Family Medicine | Admitting: Family Medicine

## 2019-12-21 ENCOUNTER — Other Ambulatory Visit: Payer: Self-pay

## 2019-12-21 DIAGNOSIS — N63 Unspecified lump in unspecified breast: Secondary | ICD-10-CM

## 2019-12-21 DIAGNOSIS — R928 Other abnormal and inconclusive findings on diagnostic imaging of breast: Secondary | ICD-10-CM | POA: Diagnosis not present

## 2020-01-22 ENCOUNTER — Ambulatory Visit: Payer: BC Managed Care – PPO | Admitting: Family Medicine

## 2020-01-26 ENCOUNTER — Other Ambulatory Visit: Payer: Self-pay

## 2020-01-26 ENCOUNTER — Encounter: Payer: Self-pay | Admitting: Family Medicine

## 2020-01-26 ENCOUNTER — Ambulatory Visit (INDEPENDENT_AMBULATORY_CARE_PROVIDER_SITE_OTHER): Payer: BC Managed Care – PPO | Admitting: Family Medicine

## 2020-01-26 VITALS — BP 104/62 | HR 64 | Ht 64.0 in | Wt 161.8 lb

## 2020-01-26 DIAGNOSIS — F411 Generalized anxiety disorder: Secondary | ICD-10-CM

## 2020-01-26 DIAGNOSIS — E119 Type 2 diabetes mellitus without complications: Secondary | ICD-10-CM

## 2020-01-26 DIAGNOSIS — E118 Type 2 diabetes mellitus with unspecified complications: Secondary | ICD-10-CM | POA: Diagnosis not present

## 2020-01-26 DIAGNOSIS — E663 Overweight: Secondary | ICD-10-CM

## 2020-01-26 MED ORDER — ESCITALOPRAM OXALATE 10 MG PO TABS: 10.0000 mg | ORAL_TABLET | Freq: Every day | ORAL | 1 refills | Status: DC

## 2020-01-26 MED ORDER — METFORMIN HCL ER 500 MG PO TB24: 500.0000 mg | ORAL_TABLET | Freq: Every day | ORAL | 2 refills | Status: DC

## 2020-01-26 NOTE — Patient Instructions (Signed)

## 2020-01-26 NOTE — Assessment & Plan Note (Signed)
Recurring. CBT recommended in addition to pharmacologic agents. She prefers medication alone. Starts Lexapro 10 mg qd. Advised it takes about 4 weeks for her to see the effect. F/U with me in 4 weeks. Sooner if symptoms worsens despite treatment.

## 2020-01-26 NOTE — Assessment & Plan Note (Signed)
She though she is losing a lot of weight. She is up to date with her cancer screens. She had not lost much since last visit. I think she is at a good weight and will still do fine if she loses few more pounds. Body mass index is 27.77 kg/m. Monitor closely.

## 2020-01-26 NOTE — Progress Notes (Signed)
    SUBJECTIVE:   CHIEF COMPLAINT / HPI:   Anxiety:Here for follow-up. She was previously diagnosed in 2018. She was on Zoloft for a while but she d/ced it. She feels like her anxiety is worsening in the past few months with her daughter's depression diagnosis and COVID-19. She will like to get back on medication. She is not interested in therapy. DM2: She is compliant with Metformin XL 500 mg QD. No concerns. Weight loss: She is concern that she is losing weight. Here diet has not changed. She drinks ginger mixed with lime and lemon daily. No GI or GU symptoms.  PERTINENT  PMH / PSH: PMX reviewed  OBJECTIVE:   BP 104/62   Pulse 64   Ht 5\' 4"  (1.626 m)   Wt 161 lb 12.8 oz (73.4 kg)   SpO2 99%   BMI 27.77 kg/m     Physical Exam Vitals reviewed.  Cardiovascular:     Rate and Rhythm: Normal rate and regular rhythm.     Heart sounds: Normal heart sounds. No murmur.  Pulmonary:     Effort: Pulmonary effort is normal. No respiratory distress.     Breath sounds: Normal breath sounds. No wheezing or rhonchi.  Abdominal:     General: Bowel sounds are normal.     Palpations: Abdomen is soft. There is no mass.     Tenderness: There is no abdominal tenderness.  Musculoskeletal:     Cervical back: Normal range of motion.     Right lower leg: No edema.     Left lower leg: No edema.  Psychiatric:        Thought Content: Thought content is not paranoid or delusional. Thought content does not include suicidal ideation. Thought content does not include suicidal plan.        Cognition and Memory: Cognition normal.        Judgment: Judgment normal.      Office Visit from 01/26/2020 in [facility]  PHQ-9 Total Score  6      GAD 7 : Generalized Anxiety Score 01/26/2020 01/04/2017  Nervous, Anxious, on Edge 3 1  Control/stop worrying 3 1  Worry too much - different things 3 0  Trouble relaxing 1 1  Restless 0 0  Easily annoyed or irritable 3 1  Afraid - awful might  happen 3 3  Total GAD 7 Score 16 7  Anxiety Difficulty Extremely difficult Extremely difficult    ASSESSMENT/PLAN:   GAD (generalized anxiety disorder) Recurring. CBT recommended in addition to pharmacologic agents. She prefers medication alone. Starts Lexapro 10 mg qd. Advised it takes about 4 weeks for her to see the effect. F/U with me in 4 weeks. Sooner if symptoms worsens despite treatment.  DM (diabetes mellitus), type 2 (HCC) Recent A1C reviewed and discussed with her. Looks good. Continue current regimen. I refilled her Metformin.  Overweight She though she is losing a lot of weight. She is up to date with her cancer screens. She had not lost much since last visit. I think she is at a good weight and will still do fine if she loses few more pounds. Body mass index is 27.77 kg/m. Monitor closely.     Andrena Mews, MD [place]

## 2020-01-26 NOTE — Assessment & Plan Note (Signed)
Recent A1C reviewed and discussed with her. Looks good. Continue current regimen. I refilled her Metformin.

## 2020-03-03 DIAGNOSIS — E119 Type 2 diabetes mellitus without complications: Secondary | ICD-10-CM | POA: Diagnosis not present

## 2020-03-03 LAB — HM DIABETES EYE EXAM

## 2020-03-25 ENCOUNTER — Other Ambulatory Visit: Payer: Self-pay

## 2020-03-25 MED ORDER — ATORVASTATIN CALCIUM 10 MG PO TABS: ORAL_TABLET | ORAL | 1 refills | Status: DC

## 2020-04-28 ENCOUNTER — Other Ambulatory Visit: Payer: Self-pay | Admitting: Family Medicine

## 2020-04-28 DIAGNOSIS — Z1231 Encounter for screening mammogram for malignant neoplasm of breast: Secondary | ICD-10-CM

## 2020-05-05 ENCOUNTER — Ambulatory Visit: Payer: BC Managed Care – PPO

## 2020-05-05 ENCOUNTER — Other Ambulatory Visit: Payer: Self-pay

## 2020-05-05 ENCOUNTER — Ambulatory Visit
Admission: RE | Admit: 2020-05-05 | Discharge: 2020-05-05 | Disposition: A | Discharge status: Home / Self Care | Payer: BC Managed Care – PPO | Source: Ambulatory Visit | Attending: Family Medicine | Admitting: Family Medicine

## 2020-05-05 DIAGNOSIS — Z1231 Encounter for screening mammogram for malignant neoplasm of breast: Secondary | ICD-10-CM | POA: Diagnosis not present

## 2020-05-16 ENCOUNTER — Other Ambulatory Visit: Payer: Self-pay

## 2020-05-16 MED ORDER — METFORMIN HCL ER 500 MG PO TB24: 500.0000 mg | ORAL_TABLET | Freq: Every day | ORAL | 2 refills | Status: DC

## 2020-06-20 ENCOUNTER — Other Ambulatory Visit: Payer: Self-pay | Admitting: Family Medicine

## 2020-06-24 DIAGNOSIS — Z20822 Contact with and (suspected) exposure to covid-19: Secondary | ICD-10-CM | POA: Diagnosis not present

## 2020-06-27 ENCOUNTER — Encounter: Payer: Self-pay | Admitting: Family Medicine

## 2020-06-28 ENCOUNTER — Ambulatory Visit (INDEPENDENT_AMBULATORY_CARE_PROVIDER_SITE_OTHER): Payer: BC Managed Care – PPO | Admitting: Family Medicine

## 2020-06-28 ENCOUNTER — Encounter: Payer: Self-pay | Admitting: Family Medicine

## 2020-06-28 ENCOUNTER — Other Ambulatory Visit: Payer: Self-pay

## 2020-06-28 VITALS — BP 102/62 | HR 74 | Wt 162.2 lb

## 2020-06-28 DIAGNOSIS — E119 Type 2 diabetes mellitus without complications: Secondary | ICD-10-CM | POA: Diagnosis not present

## 2020-06-28 DIAGNOSIS — F411 Generalized anxiety disorder: Secondary | ICD-10-CM | POA: Diagnosis not present

## 2020-06-28 LAB — POCT GLYCOSYLATED HEMOGLOBIN (HGB A1C): HbA1c, POC (controlled diabetic range): 5.9 % (ref 0.0–7.0)

## 2020-06-28 MED ORDER — METFORMIN HCL ER 500 MG PO TB24
500.0000 mg | ORAL_TABLET | Freq: Every day | ORAL | 2 refills | Status: DC
Start: 2020-06-28 — End: 2020-12-06

## 2020-06-28 MED ORDER — ESCITALOPRAM OXALATE 20 MG PO TABS: 20.0000 mg | ORAL_TABLET | Freq: Every day | ORAL | 1 refills | Status: DC

## 2020-06-28 NOTE — Assessment & Plan Note (Signed)
Exacerbated by stress at work and home. Increased Lexapro to 20 mg qd. I again offered counseling but she declined for now. F/U in 4-8 weeks for reassessment.

## 2020-06-28 NOTE — Patient Instructions (Signed)

## 2020-06-28 NOTE — Assessment & Plan Note (Addendum)
Her A1C is beautiful today at 5.9 No med adjustment needed for now. Metformin refilled. Urine microalbumin checked today. She is not on ACE due to low normal BP. She will obtain her ophthalmology report for me to review.

## 2020-06-28 NOTE — Progress Notes (Signed)
    SUBJECTIVE:   CHIEF COMPLAINT / HPI:   DM2:She is here for f/u. She is compliant with her Metformin 500 mg XL QD. Her home CBGs used to be in the 120s but now in the 140, 150, 160s. Her appetite is low during the day and she does not eat for hours. However, during the evening she eats a large and sometimes unhealthy meal. Denies any other concerns.  Anxiety: Some improvement with Lexapro 10 mg QD. She is more concerned about her daughter who sometimes demonstrates some immaturity. Her daughter is also dealing with depression and she finally agreed to therapy. She is happy about that. She is also worried about her job which might end in April of 2022 due to [place] coming up soon. She feels well otherwise.  PERTINENT  PMH / PSH: PMX reviewed.  OBJECTIVE:   Vitals:   06/28/20 0907  BP: 102/62  Pulse: 74  SpO2: 95%  Weight: 162 lb 3.2 oz (73.6 kg)   Physical Exam Vitals and nursing note reviewed.  Cardiovascular:     Rate and Rhythm: Normal rate and regular rhythm.     Pulses: Normal pulses.     Heart sounds: Normal heart sounds. No murmur heard.   Pulmonary:     Effort: Pulmonary effort is normal. No respiratory distress.     Breath sounds: Normal breath sounds. No wheezing or rhonchi.  Abdominal:     General: Abdomen is flat. Bowel sounds are normal. There is no distension.     Palpations: Abdomen is soft. There is no mass.     Tenderness: There is no abdominal tenderness.  Musculoskeletal:     Right lower leg: No edema.     Left lower leg: No edema.     Comments: Sensory exam of the foot is normal, tested with the monofilament. Good pulses, no lesions or ulcers, good peripheral pulses.   Psychiatric:     Comments:   Office Visit from 06/28/2020 in [place]  PHQ-9 Total Score 6        GAD 7 : Generalized Anxiety Score 06/28/2020 01/26/2020 01/04/2017  Nervous, Anxious, on Edge 3 3 1   Control/stop worrying 0 3 1  Worry too much -  different things 3 3 0  Trouble relaxing 2 1 1   Restless 1 0 0  Easily annoyed or irritable 3 3 1   Afraid - awful might happen 3 3 3   Total GAD 7 Score 15 16 7   Anxiety Difficulty Somewhat difficult Extremely difficult Extremely difficult       ASSESSMENT/PLAN:   DM (diabetes mellitus), type 2 ([place]) Her A1C is beautiful today at 5.9 No med adjustment needed for now. Metformin refilled. Urine microalbumin checked today. She is not on ACE due to low normal BP. She will obtain her ophthalmology report for me to review.  GAD (generalized anxiety disorder) Exacerbated by stress at work and home. Increased Lexapro to 20 mg qd. I again offered counseling but she declined for now. F/U in 4-8 weeks for reassessment.   COVID-19 vaccine counseling done. She will like to defer vaccination for now.  I will readdress at subsequent visits.  Andrena Mews, MD [place]

## 2020-06-29 LAB — MICROALBUMIN / CREATININE URINE RATIO
Creatinine, Urine: 92.2 mg/dL
Microalb/Creat Ratio: 6 mg/g creat (ref 0–29)
Microalbumin, Urine: 5.1 ug/mL

## 2020-07-26 ENCOUNTER — Other Ambulatory Visit: Payer: Self-pay | Admitting: Family Medicine

## 2020-08-30 DIAGNOSIS — H40013 Open angle with borderline findings, low risk, bilateral: Secondary | ICD-10-CM | POA: Diagnosis not present

## 2020-10-03 ENCOUNTER — Telehealth: Payer: Self-pay | Admitting: Family Medicine

## 2020-10-03 DIAGNOSIS — Z2821 Immunization not carried out because of patient refusal: Secondary | ICD-10-CM | POA: Insufficient documentation

## 2020-10-03 NOTE — Telephone Encounter (Signed)
Patient declined COVID shot during her last visit. I called to follow-up on this and to schedule her flu shot.  She stated that she never takes flu shot and declined both flu shot and COVID-19 vaccine.

## 2020-11-22 ENCOUNTER — Ambulatory Visit: Payer: BC Managed Care – PPO | Admitting: Family Medicine

## 2020-12-06 ENCOUNTER — Other Ambulatory Visit: Payer: Self-pay

## 2020-12-06 ENCOUNTER — Encounter: Payer: Self-pay | Admitting: Family Medicine

## 2020-12-06 ENCOUNTER — Ambulatory Visit (INDEPENDENT_AMBULATORY_CARE_PROVIDER_SITE_OTHER): Payer: BC Managed Care – PPO | Admitting: Family Medicine

## 2020-12-06 ENCOUNTER — Other Ambulatory Visit: Payer: Self-pay | Admitting: Family Medicine

## 2020-12-06 VITALS — BP 110/60 | HR 67 | Ht 64.0 in | Wt 165.2 lb

## 2020-12-06 DIAGNOSIS — E119 Type 2 diabetes mellitus without complications: Secondary | ICD-10-CM

## 2020-12-06 DIAGNOSIS — F411 Generalized anxiety disorder: Secondary | ICD-10-CM

## 2020-12-06 DIAGNOSIS — E118 Type 2 diabetes mellitus with unspecified complications: Secondary | ICD-10-CM

## 2020-12-06 LAB — POCT GLYCOSYLATED HEMOGLOBIN (HGB A1C): Hemoglobin A1C: 6.2 % — AB (ref 4.0–5.6)

## 2020-12-06 MED ORDER — ZOSTER VAC RECOMB ADJUVANTED 50 MCG/0.5ML IM SUSR: 0.5000 mL | Freq: Once | INTRAMUSCULAR | 1 refills | Status: AC

## 2020-12-06 MED ORDER — METFORMIN HCL ER 500 MG PO TB24
500.0000 mg | ORAL_TABLET | Freq: Every day | ORAL | 1 refills | Status: DC
Start: 2020-12-06 — End: 2021-02-10

## 2020-12-06 MED ORDER — ESCITALOPRAM OXALATE 20 MG PO TABS
20.0000 mg | ORAL_TABLET | Freq: Every day | ORAL | 1 refills | Status: DC
Start: 2020-12-06 — End: 2021-05-29

## 2020-12-06 NOTE — Assessment & Plan Note (Signed)
Her GAD7 is pretty high. She is non-suicidal. Although she worry a lot about her daughter, Lillie Fragmin, has been supporting Ms. Laforest in a positive way. I suspect with family support, counseling and medication, Ms. Remmers will do well. Lexapro 20 mg QD escribed. D/U in 4-8 weeks for reassessment.

## 2020-12-06 NOTE — Assessment & Plan Note (Addendum)
Her A1C worsened a bit today. Diet and exercise counseling provided. I will not dose adjust her metformin for now, given time allowance to work on her diet. I have escribed her Metformin XL 500 mg QD to her pharmacy. She will return to lab tomorrow for FLP. Future order placed.

## 2020-12-06 NOTE — Patient Instructions (Signed)
[email or web address]">  Generalized Anxiety Disorder, Adult Generalized anxiety disorder (GAD) is a mental health condition. Unlike normal worries, anxiety related to GAD is not triggered by a specific event. These worries do not fade or get better with time. GAD interferes with relationships, work, and school. GAD symptoms can vary from mild to severe. People with severe GAD can have intense waves of anxiety with physical symptoms that are similar to panic attacks. What are the causes? The exact cause of GAD is not known, but the following are believed to have an impact:  Differences in natural brain chemicals.  Genes passed down from parents to children.  Differences in the way threats are perceived.  Development during childhood.  Personality. What increases the risk? The following factors may make you more likely to develop this condition:  Being female.  Having a family history of anxiety disorders.  Being very shy.  Experiencing very stressful life events, such as the death of a loved one.  Having a very stressful family environment. What are the signs or symptoms? People with GAD often worry excessively about many things in their lives, such as their health and family. Symptoms may also include:  Mental and emotional symptoms: ? Worrying excessively about natural disasters. ? Fear of being late. ? Difficulty concentrating. ? Fears that others are judging your performance.  Physical symptoms: ? Fatigue. ? Headaches, muscle tension, muscle twitches, trembling, or feeling shaky. ? Feeling like your heart is pounding or beating very fast. ? Feeling out of breath or like you cannot take a deep breath. ? Having trouble falling asleep or staying asleep, or experiencing restlessness. ? Sweating. ? Nausea, diarrhea, or irritable bowel syndrome (IBS).  Behavioral symptoms: ? Experiencing erratic moods or irritability. ? Avoidance of new situations. ? Avoidance of  people. ? Extreme difficulty making decisions. How is this diagnosed? This condition is diagnosed based on your symptoms and medical history. You will also have a physical exam. Your health care provider may perform tests to rule out other possible causes of your symptoms. To be diagnosed with GAD, a person must have anxiety that:  Is out of his or her control.  Affects several different aspects of his or her life, such as work and relationships.  Causes distress that makes him or her unable to take part in normal activities.  Includes at least three symptoms of GAD, such as restlessness, fatigue, trouble concentrating, irritability, muscle tension, or sleep problems. Before your health care provider can confirm a diagnosis of GAD, these symptoms must be present more days than they are not, and they must last for 6 months or longer. How is this treated? This condition may be treated with:  Medicine. Antidepressant medicine is usually prescribed for long-term daily control. Anti-anxiety medicines may be added in severe cases, especially when panic attacks occur.  Talk therapy (psychotherapy). Certain types of talk therapy can be helpful in treating GAD by providing support, education, and guidance. Options include: ? Cognitive behavioral therapy (CBT). People learn coping skills and self-calming techniques to ease their physical symptoms. They learn to identify unrealistic thoughts and behaviors and to replace them with more appropriate thoughts and behaviors. ? Acceptance and commitment therapy (ACT). This treatment teaches people how to be mindful as a way to cope with unwanted thoughts and feelings. ? Biofeedback. This process trains you to manage your body's response (physiological response) through breathing techniques and relaxation methods. You will work with a therapist while machines are used to monitor your physical   symptoms.  Stress management techniques. These include yoga,  meditation, and exercise. A mental health specialist can help determine which treatment is best for you. Some people see improvement with one type of therapy. However, other people require a combination of therapies.   Follow these instructions at home: Lifestyle  Maintain a consistent routine and schedule.  Anticipate stressful situations. Create a plan, and allow extra time to work with your plan.  Practice stress management or self-calming techniques that you have learned from your therapist or your health care provider. General instructions  Take over-the-counter and prescription medicines only as told by your health care provider.  Understand that you are likely to have setbacks. Accept this and be kind to yourself as you persist to take better care of yourself.  Recognize and accept your accomplishments, even if you judge them as small.  Keep all follow-up visits as told by your health care provider. This is important. Contact a health care provider if:  Your symptoms do not get better.  Your symptoms get worse.  You have signs of depression, such as: ? A persistently sad or irritable mood. ? Loss of enjoyment in activities that used to bring you joy. ? Change in weight or eating. ? Changes in sleeping habits. ? Avoiding friends or family members. ? Loss of energy for normal tasks. ? Feelings of guilt or worthlessness. Get help right away if:  You have serious thoughts about hurting yourself or others. If you ever feel like you may hurt yourself or others, or have thoughts about taking your own life, get help right away. Go to your nearest emergency department or:  Call your local emergency services (911 in the [place]  Call a suicide crisis helpline, such as the National Suicide Prevention Lifeline at 1-800-273-8255. This is open 24 hours a day in the [place]  Text the Crisis Text Line at 741741 (in the [place] Summary  Generalized anxiety disorder (GAD) is a mental  health condition that involves worry that is not triggered by a specific event.  People with GAD often worry excessively about many things in their lives, such as their health and family.  GAD may cause symptoms such as restlessness, trouble concentrating, sleep problems, frequent sweating, nausea, diarrhea, headaches, and trembling or muscle twitching.  A mental health specialist can help determine which treatment is best for you. Some people see improvement with one type of therapy. However, other people require a combination of therapies. This information is not intended to replace advice given to you by your health care provider. Make sure you discuss any questions you have with your health care provider. Document Revised: 08/12/2019 Document Reviewed: 08/12/2019 Elsevier Patient Education  2021 Elsevier Inc.  

## 2020-12-06 NOTE — Addendum Note (Signed)
Addended by: Andrena Mews T on: 12/06/2020 12:18 PM   Modules accepted: Orders

## 2020-12-06 NOTE — Progress Notes (Addendum)
    SUBJECTIVE:   CHIEF COMPLAINT / HPI:  DM: She is compliant with her metformin, but not with her diet. She sometimes wakes up in the middle of the diet to have a snack. She has been drinking a lot of soda lately. She is doing well with exercise walking 2 miles daily with her dog.  Anxiety: She feels she has improved some. She is now connected with [facility] counseling and has an upcoming appointment for next week with her counselor. She is compliant with Escitalopram 20 mg qd.  PERTINENT  PMH / PSH: PMX reviewed  OBJECTIVE:   BP 110/60   Pulse 67   Ht 5\' 4"  (1.626 m)   Wt 165 lb 3.2 oz (74.9 kg)   SpO2 97%   BMI 28.36 kg/m   Physical Exam Vitals and nursing note reviewed.  Constitutional:      Appearance: Normal appearance. She is not ill-appearing.  Neurological:     Mental Status: She is alert.  Psychiatric:        Mood and Affect: Mood normal.        Behavior: Behavior normal.        Thought Content: Thought content normal.    [facility] Office Visit from 12/06/2020 in [facility]  PHQ-9 Total Score 13     GAD 7 : Generalized Anxiety Score 12/06/2020 06/28/2020 01/26/2020 01/04/2017  Nervous, Anxious, on Edge 1 3 3 1   Control/stop worrying 3 0 3 1  Worry too much - different things 3 3 3  0  Trouble relaxing 3 2 1 1   Restless 3 1 0 0  Easily annoyed or irritable 2 3 3 1   Afraid - awful might happen 3 3 3 3   Total GAD 7 Score 18 15 16 7   Anxiety Difficulty - Somewhat difficult Extremely difficult Extremely difficult       ASSESSMENT/PLAN:   DM (diabetes mellitus), type 2 (HCC) Her A1C worsened a bit today. Diet and exercise counseling provided. I will not dose adjust her metformin for now, given time allowance to work on her diet. I have escribed her Metformin XL 500 mg QD to her pharmacy. She will return to lab tomorrow for FLP. Future order placed.  GAD (generalized anxiety disorder) Her GAD7 is pretty high. She is  non-suicidal. Although she worry a lot about her daughter, Lillie Fragmin, has been supporting Ms. Kirkpatrick in a positive way. I suspect with family support, counseling and medication, Ms. Rudzinski will do well. Lexapro 20 mg QD escribed. D/U in 4-8 weeks for reassessment.   HM: Shingles vaccination information and counseling provided during this visit. She is interested in getting vaccinated. She will contact pharmacy to schedule her vaccination Shingrix escribed.   Andrena Mews, MD [place]

## 2020-12-07 ENCOUNTER — Other Ambulatory Visit: Payer: BC Managed Care – PPO

## 2020-12-07 ENCOUNTER — Other Ambulatory Visit: Payer: Self-pay

## 2020-12-07 DIAGNOSIS — E118 Type 2 diabetes mellitus with unspecified complications: Secondary | ICD-10-CM

## 2020-12-08 LAB — BASIC METABOLIC PANEL
BUN/Creatinine Ratio: 12 (ref 9–23)
BUN: 9 mg/dL (ref 6–24)
CO2: 26 mmol/L (ref 20–29)
Calcium: 9.1 mg/dL (ref 8.7–10.2)
Chloride: 103 mmol/L (ref 96–106)
Creatinine, Ser: 0.76 mg/dL (ref 0.57–1.00)
GFR calc Af Amer: 99 mL/min/{1.73_m2} (ref >59)
GFR calc non Af Amer: 86 mL/min/{1.73_m2} (ref >59)
Glucose: 133 mg/dL — ABNORMAL HIGH (ref 65–99)
Potassium: 4.1 mmol/L (ref 3.5–5.2)
Sodium: 140 mmol/L (ref 134–144)

## 2020-12-08 LAB — LIPID PANEL
Chol/HDL Ratio: 2.9 ratio (ref 0.0–4.4)
Cholesterol, Total: 126 mg/dL (ref 100–199)
HDL: 43 mg/dL (ref >39)
LDL Chol Calc (NIH): 59 mg/dL (ref 0–99)
Triglycerides: 135 mg/dL (ref 0–149)
VLDL Cholesterol Cal: 24 mg/dL (ref 5–40)

## 2020-12-27 ENCOUNTER — Other Ambulatory Visit: Payer: Self-pay | Admitting: Family Medicine

## 2021-02-10 ENCOUNTER — Other Ambulatory Visit: Payer: Self-pay | Admitting: Family Medicine

## 2021-02-24 LAB — HM DIABETES EYE EXAM

## 2021-02-27 LAB — HM DIABETES EYE EXAM

## 2021-04-16 ENCOUNTER — Encounter: Payer: Self-pay | Admitting: Family Medicine

## 2021-04-18 ENCOUNTER — Encounter: Payer: Self-pay | Admitting: Family Medicine

## 2021-05-27 ENCOUNTER — Other Ambulatory Visit: Payer: Self-pay | Admitting: Family Medicine

## 2021-06-02 ENCOUNTER — Ambulatory Visit: Payer: BC Managed Care – PPO | Admitting: Family Medicine

## 2021-06-05 ENCOUNTER — Encounter: Payer: Self-pay | Admitting: Family Medicine

## 2021-06-06 ENCOUNTER — Other Ambulatory Visit: Payer: Self-pay

## 2021-06-06 ENCOUNTER — Other Ambulatory Visit: Payer: Self-pay | Admitting: Family Medicine

## 2021-06-06 ENCOUNTER — Ambulatory Visit: Payer: BC Managed Care – PPO | Admitting: Family Medicine

## 2021-06-06 ENCOUNTER — Encounter: Payer: Self-pay | Admitting: Family Medicine

## 2021-06-06 ENCOUNTER — Ambulatory Visit
Admission: RE | Admit: 2021-06-06 | Discharge: 2021-06-06 | Disposition: A | Discharge status: Home / Self Care | Payer: BC Managed Care – PPO | Source: Ambulatory Visit | Attending: Family Medicine | Admitting: Family Medicine

## 2021-06-06 VITALS — BP 120/75 | HR 64 | Ht 65.0 in | Wt 161.0 lb

## 2021-06-06 DIAGNOSIS — Z1231 Encounter for screening mammogram for malignant neoplasm of breast: Secondary | ICD-10-CM

## 2021-06-06 DIAGNOSIS — E119 Type 2 diabetes mellitus without complications: Secondary | ICD-10-CM | POA: Diagnosis not present

## 2021-06-06 DIAGNOSIS — E781 Pure hyperglyceridemia: Secondary | ICD-10-CM | POA: Diagnosis not present

## 2021-06-06 DIAGNOSIS — D229 Melanocytic nevi, unspecified: Secondary | ICD-10-CM | POA: Diagnosis not present

## 2021-06-06 DIAGNOSIS — Z23 Encounter for immunization: Secondary | ICD-10-CM | POA: Diagnosis not present

## 2021-06-06 LAB — POCT GLYCOSYLATED HEMOGLOBIN (HGB A1C): HbA1c, POC (prediabetic range): 6.2 % (ref 5.7–6.4)

## 2021-06-06 LAB — POCT UA - MICROALBUMIN
Albumin/Creatinine Ratio, Urine, POC: <30
Creatinine, POC: 200 mg/dL
Microalbumin Ur, POC: 10 mg/L

## 2021-06-06 NOTE — Progress Notes (Signed)
    SUBJECTIVE:   CHIEF COMPLAINT / HPI:   DM2/HLD: She is compliant with Metformin  XR 500 mg QD and Lipitor 10 mg twice weekly.  GAD: Coping well on her current regimen of Lexapro 20 mg QD.   Skin lesions: C/O a dark mole above her left ear which she noticed many years ago. No change in size, but it gets itchy and irritative sometimes. She also noticed a small knot above her right ear about six months ago, which is asymptomatic.  PERTINENT  PMH / PSH: PMX reviewed  OBJECTIVE:   Vitals:   06/06/21 0842  BP: 120/75  Pulse: 64  Weight: 161 lb (73 kg)  Height: '5\' 5"'$  (1.651 m)    Physical Exam Vitals and nursing note reviewed.  HENT:     Head:     Comments: A small, hyperpigmented mole measuring approximately < 0.25m on her left temporomandibular area above the helix. A soft, nodular lesion approximately < 139mabove her right helix. No tenderness, no erythema. Cardiovascular:     Rate and Rhythm: Normal rate and regular rhythm.     Heart sounds: Normal heart sounds. No murmur heard. Pulmonary:     Effort: Pulmonary effort is normal.     Breath sounds: Normal breath sounds. No wheezing.  Abdominal:     General: Bowel sounds are normal.     Palpations: There is no mass.     Tenderness: There is no abdominal tenderness.  Musculoskeletal:        General: No swelling or tenderness.     ASSESSMENT/PLAN:   DM (diabetes mellitus), type 2 (HCC) A1C checked today remains good. Urine microalbumin is normal. Continue current dose of Metformin. Discussed Metformin holiday in the future to try diet control only. She agreed with the plan.   Hypertriglyceridemia Slightly elevated Triglyceride in 2019. Well controlled on her current Statin regimen.    Skin lesions: Likely benign left temporomandibular mole and cyst of the right I advised her to schedule a derm clinic appointment for further management.  KeAndrena MewsMD [place]

## 2021-06-06 NOTE — Patient Instructions (Signed)
Zoster Vaccine, Recombinant injection What is this medication? ZOSTER VACCINE (ZOS ter vak SEEN) is a vaccine used to reduce the risk of getting shingles. This vaccine is not used to treat shingles or nerve pain fromshingles. This medicine may be used for other purposes; ask your health care provider orpharmacist if you have questions. COMMON BRAND NAME(S): [facility] What should I tell my care team before I take this medication? They need to know if you have any of these conditions: cancer immune system problems an unusual or allergic reaction to Zoster vaccine, other medications, foods, dyes, or preservatives pregnant or trying to get pregnant breast-feeding How should I use this medication? This vaccine is injected into a muscle. It is given by a health care provider. A copy of Vaccine Information Statements will be given before each vaccination. Be sure to read this information carefully each time. This sheet may changeoften. Talk to your health care provider about the use of this vaccine in children.This vaccine is not approved for use in children. Overdosage: If you think you have taken too much of this medicine contact apoison control center or emergency room at once. NOTE: This medicine is only for you. Do not share this medicine with others. What if I miss a dose? Keep appointments for follow-up (booster) doses. It is important not to miss your dose. Call your health care provider if you are unable to keep anappointment. What may interact with this medication? medicines that suppress your immune system medicines to treat cancer steroid medicines like prednisone or cortisone This list may not describe all possible interactions. Give your health care provider a list of all the medicines, herbs, non-prescription drugs, or dietary supplements you use. Also tell them if you smoke, drink alcohol, or use illegaldrugs. Some items may interact with your medicine. What should I watch for while  using this medication? Visit your health care provider regularly. This vaccine, like all vaccines, may not fully protect everyone. What side effects may I notice from receiving this medication? Side effects that you should report to your doctor or health care professionalas soon as possible: allergic reactions (skin rash, itching or hives; swelling of the face, lips, or tongue) trouble breathing Side effects that usually do not require medical attention (report these toyour doctor or health care professional if they continue or are bothersome): chills headache fever nausea pain, redness, or irritation at site where injected tiredness vomiting This list may not describe all possible side effects. Call your doctor for medical advice about side effects. You may report side effects to FDA at1-800-FDA-1088. Where should I keep my medication? This vaccine is only given by a health care provider. It will not be stored athome. NOTE: This sheet is a summary. It may not cover all possible information. If you have questions about this medicine, talk to your doctor, pharmacist, orhealth care provider.  2022 Elsevier/Gold Standard (2019-11-27 16:23:07)

## 2021-06-07 NOTE — Assessment & Plan Note (Signed)
A1C checked today remains good. Urine microalbumin is normal. Continue current dose of Metformin. Discussed Metformin holiday in the future to try diet control only. She agreed with the plan.

## 2021-06-07 NOTE — Assessment & Plan Note (Signed)
Slightly elevated Triglyceride in 2019. Well controlled on her current Statin regimen.

## 2021-06-12 ENCOUNTER — Encounter: Payer: Self-pay | Admitting: Family Medicine

## 2021-07-26 NOTE — Progress Notes (Signed)
    SUBJECTIVE:   CHIEF COMPLAINT / HPI:   Skin lesion above ear - Been there for several years - No changes to color, size, borders - Sometimes pruritic and bothersome, is just underneath where her glasses rub against the side of her head - No personal or family history of melanoma or other skin cancers -Was seen on 06/06/2021 by Dr. Gwendlyn Deutscher for this, who recommended dermatology clinic for removal  PERTINENT  PMH / PSH: T2DM, GAD, hypertriglyceridemia, menopause, overweight  OBJECTIVE:   BP 112/70   Pulse 71   Wt 163 lb 3.2 oz (74 kg)   SpO2 97%   BMI 27.16 kg/m    PHQ-9:  Depression screen [facility] 2/9 06/06/2021 06/06/2021 12/06/2020  Decreased Interest 0 0 0  Down, Depressed, Hopeless 0 0 0  PHQ - 2 Score 0 0 0  Altered sleeping 0 0 3  Tired, decreased energy 0 0 1  Change in appetite 1 1 3   Feeling bad or failure about yourself  0 0 2  Trouble concentrating 1 1 3   Moving slowly or fidgety/restless 0 0 1  Suicidal thoughts 0 0 0  PHQ-9 Score 2 2 13   Difficult doing work/chores Somewhat difficult Somewhat difficult Very difficult  Some recent data might be hidden    GAD-7:  GAD 7 : Generalized Anxiety Score 06/06/2021 12/06/2020 06/28/2020 01/26/2020  Nervous, Anxious, on Edge 0 1 3 3   Control/stop worrying 0 3 0 3  Worry too much - different things 0 3 3 3   Trouble relaxing 0 3 2 1   Restless 1 3 1  0  Easily annoyed or irritable - 2 3 3   Afraid - awful might happen - 3 3 3   Total GAD 7 Score - 18 15 16   Anxiety Difficulty - - Somewhat difficult Extremely difficult   Physical Exam General: Awake, alert, oriented, no acute distress Respiratory: Unlabored respirations, speaking in full sentences, no respiratory distress Skin: Hyperpigmented 3-4 mm circular skin lesion overlying left temple within scalp, smooth regular borders, even coloration throughout    PROCEDURE: Shave biopsy  Consent signed and scanned into record.  Pain control: 0.5 mL lidocaine 1% with  epi Preparation: area cleansed with alcohol prior to anesthesia, iodine prior to shave Skin lesion diameter and depth appreciated by thorough palpation.  Skin cleaned thoroughly with alcohol wipe.  0.5 mL lidocaine 1% with epi injected in circumferential pattern and also underneath skin lesion. Skin lesion elevated with forceps, shaved with #15 scalpel. Patient tolerated this well without bleeding or paresthesias.  Suture not indicated for incision site this small.   ASSESSMENT/PLAN:   Skin lesion Bothersome pruritic skin lesion overlying left temple, 3 to 4 mm in size.  Removed via shave biopsy with #15 scalpel.  Tolerated well with good hemostasis and without complication.  Patient given aftercare instructions and return precautions.    Ezequiel Essex, MD [place]

## 2021-07-27 ENCOUNTER — Ambulatory Visit: Payer: BC Managed Care – PPO | Admitting: Family Medicine

## 2021-07-27 ENCOUNTER — Other Ambulatory Visit: Payer: Self-pay

## 2021-07-27 DIAGNOSIS — L989 Disorder of the skin and subcutaneous tissue, unspecified: Secondary | ICD-10-CM | POA: Diagnosis not present

## 2021-07-27 NOTE — Patient Instructions (Signed)
It was wonderful to meet you today. Thank you for allowing me to be a part of your care. Below is a short summary of what we discussed at your visit today:  Mole removal - please keep the area clean and dry - we have included after care instructions in your paperwork - please call us if you develop redness, swelling, pain, or drainage from the site   Please bring all of your medications to every appointment!  If you have any questions or concerns, please do not hesitate to contact us via phone or MyChart message.   Ezequiel Essex, MD

## 2021-07-28 NOTE — Assessment & Plan Note (Signed)
Bothersome pruritic skin lesion overlying left temple, 3 to 4 mm in size.  Removed via shave biopsy with #15 scalpel.  Tolerated well with good hemostasis and without complication.  Patient given aftercare instructions and return precautions.

## 2021-09-16 ENCOUNTER — Other Ambulatory Visit: Payer: Self-pay | Admitting: Family Medicine

## 2021-09-26 ENCOUNTER — Encounter (HOSPITAL_COMMUNITY): Payer: Self-pay | Admitting: Emergency Medicine

## 2021-09-26 ENCOUNTER — Other Ambulatory Visit: Payer: Self-pay

## 2021-09-26 ENCOUNTER — Emergency Department (HOSPITAL_COMMUNITY)
Admission: EM | Admit: 2021-09-26 | Discharge: 2021-09-26 | Disposition: A | Discharge status: Home / Self Care | Payer: BC Managed Care – PPO | Attending: Emergency Medicine | Admitting: Emergency Medicine

## 2021-09-26 ENCOUNTER — Emergency Department (HOSPITAL_COMMUNITY): Payer: BC Managed Care – PPO

## 2021-09-26 DIAGNOSIS — Z7984 Long term (current) use of oral hypoglycemic drugs: Secondary | ICD-10-CM | POA: Diagnosis not present

## 2021-09-26 DIAGNOSIS — Z20822 Contact with and (suspected) exposure to covid-19: Secondary | ICD-10-CM | POA: Diagnosis not present

## 2021-09-26 DIAGNOSIS — R0789 Other chest pain: Secondary | ICD-10-CM | POA: Insufficient documentation

## 2021-09-26 DIAGNOSIS — E119 Type 2 diabetes mellitus without complications: Secondary | ICD-10-CM | POA: Diagnosis not present

## 2021-09-26 DIAGNOSIS — Z87891 Personal history of nicotine dependence: Secondary | ICD-10-CM | POA: Diagnosis not present

## 2021-09-26 LAB — CBC WITH DIFFERENTIAL/PLATELET
Abs Immature Granulocytes: 0.03 10*3/uL (ref 0.00–0.07)
Basophils Absolute: 0.1 10*3/uL (ref 0.0–0.1)
Basophils Relative: 1 %
Eosinophils Absolute: 0.1 10*3/uL (ref 0.0–0.5)
Eosinophils Relative: 1 %
HCT: 33.4 % — ABNORMAL LOW (ref 36.0–46.0)
Hemoglobin: 11.7 g/dL — ABNORMAL LOW (ref 12.0–15.0)
Immature Granulocytes: 0 %
Lymphocytes Relative: 41 %
Lymphs Abs: 3.7 10*3/uL (ref 0.7–4.0)
MCH: 30.1 pg (ref 26.0–34.0)
MCHC: 35 g/dL (ref 30.0–36.0)
MCV: 85.9 fL (ref 80.0–100.0)
Monocytes Absolute: 0.3 10*3/uL (ref 0.1–1.0)
Monocytes Relative: 4 %
Neutro Abs: 4.8 10*3/uL (ref 1.7–7.7)
Neutrophils Relative %: 53 %
Platelets: 233 10*3/uL (ref 150–400)
RBC: 3.89 MIL/uL (ref 3.87–5.11)
RDW: 11.6 % (ref 11.5–15.5)
WBC: 8.9 10*3/uL (ref 4.0–10.5)
nRBC: 0 % (ref 0.0–0.2)

## 2021-09-26 LAB — BASIC METABOLIC PANEL
Anion gap: 9 (ref 5–15)
BUN: 12 mg/dL (ref 6–20)
CO2: 25 mmol/L (ref 22–32)
Calcium: 9.1 mg/dL (ref 8.9–10.3)
Chloride: 105 mmol/L (ref 98–111)
Creatinine, Ser: 0.82 mg/dL (ref 0.44–1.00)
GFR, Estimated: >60 mL/min (ref >60)
Glucose, Bld: 136 mg/dL — ABNORMAL HIGH (ref 70–99)
Potassium: 3.8 mmol/L (ref 3.5–5.1)
Sodium: 139 mmol/L (ref 135–145)

## 2021-09-26 LAB — RESP PANEL BY RT-PCR (FLU A&B, COVID) ARPGX2
Influenza A by PCR: NEGATIVE
Influenza B by PCR: NEGATIVE
SARS Coronavirus 2 by RT PCR: NEGATIVE

## 2021-09-26 LAB — TROPONIN I (HIGH SENSITIVITY)
Troponin I (High Sensitivity): 4 ng/L (ref <18)
Troponin I (High Sensitivity): 3 ng/L (ref <18)

## 2021-09-26 NOTE — ED Provider Notes (Signed)
[facility] EMERGENCY DEPARTMENT Provider Note   CSN: [id number] Arrival date & time: 09/26/21  1810     History Chief Complaint  Patient presents with   Chest Pain    Danielle Mccann is a 60 y.o. female.  The history is provided by the patient. No language interpreter was used.  Chest Pain     60 year old female significant history of diabetes, tobacco use, obesity presenting complaining of chest pain.  Since yesterday patient has had intermittent sharp central nonradiating chest pain.  Pain usually lasting for few seconds described more of a stabbing sensation 7 out of 10 without any associated lightheadedness dizziness nausea or diaphoresis shortness of breath productive cough runny nose sneezing sore throat lightheadedness or dizziness.  No active chest pain at this time.  No recent sickness.  No prior cardiac history.  Since the pain has been recurring, prompting this ER visit.  No history of heartburn, denies increasing stress  Past Medical History:  Diagnosis Date   Breast lump 12/18/2019   Diabetes mellitus without complication ([place])    Paresthesia 09/24/2017   Skin tag 06/19/2017   Smoking 1/2 pack a day or less    Smoking 1/2 pack a day or less     Patient Active Problem List   Diagnosis Date Noted   Influenza vaccination declined by patient 10/03/2020   Hypertriglyceridemia 08/15/2018   GAD (generalized anxiety disorder) 01/04/2017   DM (diabetes mellitus), type 2 ([place]) 07/20/2016   Skin lesion 11/16/2011   Menopause 09/12/2010   Overweight 04/07/2008    Past Surgical History:  Procedure Laterality Date   NO PAST SURGERIES       OB History   No obstetric history on file.     Family History  Problem Relation Age of Onset   Stroke Mother    Diabetes Mother    Hypertension Mother    Alcohol abuse Father    Diabetes Father    Hypertension Father    Heart disease Neg Hx    Cancer Neg Hx    Colon cancer Neg Hx      Social History   Tobacco Use   Smoking status: Former    Packs/day: 0.50    Types: Cigarettes    Quit date: 11/28/2018    Years since quitting: 2.8   Smokeless tobacco: Never  Substance Use Topics   Alcohol use: No    Alcohol/week: 0.0 standard drinks   Drug use: No    Types: Marijuana    Home Medications Prior to Admission medications   Medication Sig Start Date End Date Taking? Authorizing Provider  atorvastatin (LIPITOR) 10 MG tablet TAKE 1 TABLET(10 MG) BY MOUTH 2 TIMES A WEEK 09/18/21   Eniola, Tawanna Solo T, MD  escitalopram (LEXAPRO) 20 MG tablet TAKE 1 TABLET(20 MG) BY MOUTH DAILY 05/29/21   Andrena Mews T, MD  glucose blood (FREESTYLE INSULINX TEST) test strip Use to check glucose once daily 09/24/17   Andrena Mews T, MD  glucose monitoring kit (FREESTYLE) monitoring kit Use to check glucose once daily 07/20/16   Andrena Mews T, MD  Lancets (FREESTYLE) lancets Use to check glucose once daily 09/24/17   Andrena Mews T, MD  metFORMIN (GLUCOPHAGE-XR) 500 MG 24 hr tablet TAKE 1 TABLET(500 MG) BY MOUTH DAILY 02/10/21   Lenoria Chime, MD  Omega-3 Fatty Acids (FISH OIL) 1000 MG CAPS Take 1 capsule (1,000 mg total) by mouth 2 (two) times daily. 04/05/18   Kinnie Feil, MD  Allergies    Patient has no known allergies.  Review of Systems   Review of Systems  Cardiovascular:  Positive for chest pain.  All other systems reviewed and are negative.  Physical Exam Updated Vital Signs BP (!) 151/70   Pulse 74   Temp 99.4 F (37.4 C)   Resp 18   SpO2 98%   Physical Exam Vitals and nursing note reviewed.  Constitutional:      General: She is not in acute distress.    Appearance: She is well-developed.  HENT:     Head: Atraumatic.  Eyes:     Conjunctiva/sclera: Conjunctivae normal.  Cardiovascular:     Rate and Rhythm: Normal rate and regular rhythm.     Pulses: Normal pulses.     Heart sounds: Normal heart sounds.  Pulmonary:     Effort: Pulmonary  effort is normal.     Breath sounds: Rales present. No wheezing or rhonchi.  Abdominal:     Palpations: Abdomen is soft.     Tenderness: There is no abdominal tenderness.  Musculoskeletal:     Cervical back: Neck supple.     Right lower leg: No edema.     Left lower leg: No edema.  Skin:    Findings: No rash.  Neurological:     Mental Status: She is alert.  Psychiatric:        Mood and Affect: Mood normal.    ED Results / Procedures / Treatments   Labs (all labs ordered are listed, but only abnormal results are displayed) Labs Reviewed  BASIC METABOLIC PANEL - Abnormal; Notable for the following components:      Result Value   Glucose, Bld 136 (*)    All other components within normal limits  CBC WITH DIFFERENTIAL/PLATELET - Abnormal; Notable for the following components:   Hemoglobin 11.7 (*)    HCT 33.4 (*)    All other components within normal limits  RESP PANEL BY RT-PCR (FLU A&B, COVID) ARPGX2  TROPONIN I (HIGH SENSITIVITY)  TROPONIN I (HIGH SENSITIVITY)    EKG None ED ECG REPORT   Date: 09/26/2021  Rate: 74  Rhythm: normal sinus rhythm  QRS Axis: normal  Intervals: normal  ST/T Wave abnormalities: nonspecific ST changes  Conduction Disutrbances:none  Narrative Interpretation:   Old EKG Reviewed: unchanged  I have personally reviewed the EKG tracing and agree with the computerized printout as noted.   Radiology DG Chest 2 View  Result Date: 09/26/2021 CLINICAL DATA:  Chest pain EXAM: CHEST - 2 VIEW COMPARISON:  2013 FINDINGS: Increased interstitial prominence compared to 2013. No pleural effusion. No pneumothorax. Cardiomediastinal contours are within normal limits. There is no acute osseous abnormality. IMPRESSION: Increased interstitial prominence since 2013 may reflect chronic changes, mild interstitial edema, or atypical/viral pneumonia. Electronically Signed   By: Macy Mis M.D.   On: 09/26/2021 18:56    Procedures Procedures   Medications  Ordered in ED Medications - No data to display  ED Course  I have reviewed the triage vital signs and the nursing notes.  Pertinent labs & imaging results that were available during my care of the patient were reviewed by me and considered in my medical decision making (see chart for details).    MDM Rules/Calculators/A&P                           BP 113/66 (BP Location: Right Arm)   Pulse 65   Temp 98.5 F (  36.9 C) (Oral)   Resp 18   SpO2 98%   Final Clinical Impression(s) / ED Diagnoses Final diagnoses:  Atypical chest pain    Rx / DC Orders ED Discharge Orders     None      Patient presents with chest pain atypical of ACS.  No active chest pain at this time. Negative delta troponin, hear score of 3 low risk of ACS.  Patient stable for discharge.  Recommend outpatient follow-up.  Viral respiratory panel currently pending  10:40 PM Negative viral resp panel.  Pt sxs free.  Stable for discharge with outpt f/u.    Domenic Moras, PA-C 09/26/21 2241    Truddie Hidden, MD 09/26/21 [phone]

## 2021-09-26 NOTE — Discharge Instructions (Signed)
You have been evaluated for your chest discomfort.  Fortunately no concerning features were noted during today's evaluation.  Please follow-up closely with your primary care doctor for further assessment.

## 2021-09-26 NOTE — ED Provider Notes (Signed)
Emergency Medicine Provider Triage Evaluation Note  Marquasia Schmieder , a 60 y.o. female  was evaluated in triage.  Pt complains of chest pain.  She states that since yesterday she has had intermittent sharp, central, nonradiating chest pain.  She denies shortness of breath, palpitations, lower extremity edema, lightheadedness or dizziness with the pain.  She denies any recent fevers, cough or viral upper respiratory symptoms.  Review of Systems  Positive: See above Negative:   Physical Exam  BP (!) 151/70   Pulse 74   Temp 99.4 F (37.4 C)   Resp 18   SpO2 98%  Gen:   Awake, no distress   Resp:  Normal effort, lungs clear to auscultation bilaterally MSK:   Moves extremities without difficulty  Other:  S1/S2 without murmur.  Medical Decision Making  Medically screening exam initiated at 6:16 PM.  Appropriate orders placed.  Jaysa Kise was informed that the remainder of the evaluation will be completed by another provider, this initial triage assessment does not replace that evaluation, and the importance of remaining in the ED until their evaluation is complete.     Mickie Hillier, PA-C 09/26/21 Gaynell Face, MD 09/26/21 2102

## 2021-09-26 NOTE — ED Triage Notes (Signed)
Pt c/o intermittent central chest pain. Pain is non-radiating, denies associated symptoms.

## 2021-09-26 NOTE — ED Notes (Signed)
Patient verbalizes understanding of discharge instructions. Opportunity for questioning and answers were provided. Armband removed by staff, pt discharged from ED ambulatory.   

## 2021-10-10 ENCOUNTER — Encounter: Payer: Self-pay | Admitting: Family Medicine

## 2021-10-11 ENCOUNTER — Encounter (HOSPITAL_COMMUNITY): Payer: Self-pay | Admitting: Emergency Medicine

## 2021-10-11 ENCOUNTER — Emergency Department (HOSPITAL_COMMUNITY): Payer: BC Managed Care – PPO

## 2021-10-11 ENCOUNTER — Other Ambulatory Visit: Payer: Self-pay

## 2021-10-11 ENCOUNTER — Emergency Department (HOSPITAL_COMMUNITY)
Admission: EM | Admit: 2021-10-11 | Discharge: 2021-10-12 | Disposition: A | Discharge status: Home / Self Care | Payer: BC Managed Care – PPO | Attending: Emergency Medicine | Admitting: Emergency Medicine

## 2021-10-11 DIAGNOSIS — S39012A Strain of muscle, fascia and tendon of lower back, initial encounter: Secondary | ICD-10-CM | POA: Diagnosis not present

## 2021-10-11 DIAGNOSIS — Y92410 Unspecified street and highway as the place of occurrence of the external cause: Secondary | ICD-10-CM | POA: Insufficient documentation

## 2021-10-11 DIAGNOSIS — E119 Type 2 diabetes mellitus without complications: Secondary | ICD-10-CM | POA: Insufficient documentation

## 2021-10-11 DIAGNOSIS — Z7984 Long term (current) use of oral hypoglycemic drugs: Secondary | ICD-10-CM | POA: Insufficient documentation

## 2021-10-11 DIAGNOSIS — S3992XA Unspecified injury of lower back, initial encounter: Secondary | ICD-10-CM | POA: Diagnosis present

## 2021-10-11 DIAGNOSIS — Z87891 Personal history of nicotine dependence: Secondary | ICD-10-CM | POA: Insufficient documentation

## 2021-10-11 LAB — URINALYSIS, ROUTINE W REFLEX MICROSCOPIC
Bilirubin Urine: NEGATIVE
Glucose, UA: NEGATIVE mg/dL
Hgb urine dipstick: NEGATIVE
Ketones, ur: NEGATIVE mg/dL
Leukocytes,Ua: NEGATIVE
Nitrite: NEGATIVE
Protein, ur: NEGATIVE mg/dL
Specific Gravity, Urine: 1.015 (ref 1.005–1.030)
pH: 6 (ref 5.0–8.0)
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
## 2021-10-11 NOTE — ED Notes (Signed)
Called for muse no answer
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
## 2021-10-12 MED ORDER — IBUPROFEN 600 MG PO TABS: 600.0000 mg | ORAL_TABLET | Freq: Four times a day (QID) | ORAL | 0 refills | Status: DC | PRN

## 2021-10-12 MED ORDER — HYDROCODONE-ACETAMINOPHEN 5-325 MG PO TABS: 1.0000 | ORAL_TABLET | ORAL | 0 refills | Status: DC | PRN

## 2021-10-12 MED ORDER — KETOROLAC TROMETHAMINE 30 MG/ML IJ SOLN
30.0000 mg | Freq: Once | INTRAMUSCULAR | Status: AC
  Administered 2021-10-12: 30 mg via INTRAMUSCULAR
  Filled 2021-10-12: qty 1

## 2021-10-12 MED ORDER — DIAZEPAM 5 MG PO TABS: 5.0000 mg | ORAL_TABLET | Freq: Two times a day (BID) | ORAL | 0 refills | Status: DC

## 2021-10-12 NOTE — ED Notes (Signed)
AVS with prescriptions provided to and discussed with patient. Pt verbalizes understanding of discharge instructions and denies any questions or concerns at this time. Pt ambulated out of department independently with steady gait. ? ?
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
## 2021-10-13 ENCOUNTER — Other Ambulatory Visit: Payer: Self-pay

## 2021-10-13 ENCOUNTER — Ambulatory Visit (INDEPENDENT_AMBULATORY_CARE_PROVIDER_SITE_OTHER): Payer: BC Managed Care – PPO | Admitting: Family Medicine

## 2021-10-13 ENCOUNTER — Telehealth: Payer: Self-pay

## 2021-10-13 ENCOUNTER — Encounter: Payer: Self-pay | Admitting: Family Medicine

## 2021-10-13 VITALS — BP 110/56 | HR 67 | Ht 65.0 in | Wt 165.5 lb

## 2021-10-13 DIAGNOSIS — E119 Type 2 diabetes mellitus without complications: Secondary | ICD-10-CM | POA: Diagnosis not present

## 2021-10-13 DIAGNOSIS — M549 Dorsalgia, unspecified: Secondary | ICD-10-CM | POA: Diagnosis not present

## 2021-10-13 DIAGNOSIS — M25512 Pain in left shoulder: Secondary | ICD-10-CM | POA: Diagnosis not present

## 2021-10-13 DIAGNOSIS — R079 Chest pain, unspecified: Secondary | ICD-10-CM | POA: Diagnosis not present

## 2021-10-13 LAB — POCT GLYCOSYLATED HEMOGLOBIN (HGB A1C): HbA1c, POC (controlled diabetic range): 6.7 % (ref 0.0–7.0)

## 2021-10-13 NOTE — Patient Instructions (Signed)

## 2021-10-13 NOTE — Telephone Encounter (Signed)
Spoke with patient informed of ECHO at [facility] on Tue. Jan 17th at 8:00am. Patient understood. Salvatore Marvel, CMA
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
## 2021-10-13 NOTE — Assessment & Plan Note (Signed)
A1C increased to 6.7 today. She is however still below her goal. Diet and exercise counseling provided. Continue current dose of Metformin. Repeat A1C in 3-4 months.

## 2021-11-21 ENCOUNTER — Ambulatory Visit (HOSPITAL_COMMUNITY)
Admission: RE | Admit: 2021-11-21 | Discharge: 2021-11-21 | Disposition: A | Discharge status: Home / Self Care | Payer: BC Managed Care – PPO | Source: Ambulatory Visit | Attending: Family Medicine | Admitting: Family Medicine

## 2021-11-21 ENCOUNTER — Other Ambulatory Visit: Payer: Self-pay

## 2021-11-21 ENCOUNTER — Telehealth: Payer: Self-pay | Admitting: Family Medicine

## 2021-11-21 DIAGNOSIS — R079 Chest pain, unspecified: Secondary | ICD-10-CM | POA: Diagnosis present

## 2021-11-21 DIAGNOSIS — E119 Type 2 diabetes mellitus without complications: Secondary | ICD-10-CM | POA: Insufficient documentation

## 2021-11-21 LAB — ECHOCARDIOGRAM COMPLETE
AR max vel: 1.71 cm2
AV Area VTI: 1.62 cm2
AV Area mean vel: 1.63 cm2
AV Mean grad: 5 mmHg
AV Peak grad: 8.8 mmHg
Ao pk vel: 1.48 m/s
Area-P 1/2: 3.21 cm2
MV VTI: 1.76 cm2
S' Lateral: 3 cm

## 2021-11-21 NOTE — Progress Notes (Signed)
°  Echocardiogram 2D Echocardiogram has been performed.  Danielle Mccann 11/21/2021, 8:49 AM

## 2021-11-21 NOTE — Telephone Encounter (Signed)
ECHO result discussed. G1DD otherwise reassuring report. Patient was appreciative of the call.  ECHOCARDIOGRAM COMPLETE  Result Date: 11/21/2021    ECHOCARDIOGRAM REPORT   Patient Name:   Danielle Mccann Date of Exam: 11/21/2021 Medical Rec #:  [id number]                Height:       65.0 in Accession #:    [id number]               Weight:       165.5 lb Date of Birth:  1961-10-11                 BSA:          1.825 m Patient Age:    61 years                 BP:           135/74 mmHg Patient Gender: F                        HR:           70 bpm. Exam Location:  Inpatient Procedure: 2D Echo, Cardiac Doppler and Color Doppler Indications:    Chest pain  History:        Patient has no prior history of Echocardiogram examinations.                 Risk Factors:Diabetes.  Sonographer:    Clayton Lefort RDCS (AE) Referring Phys: [id number] Phill Myron Fate [place] IMPRESSIONS  1. Left ventricular ejection fraction, by estimation, is 60 to 65%. The left ventricle has normal function. The left ventricle has no regional wall motion abnormalities. Left ventricular diastolic parameters are consistent with Grade I diastolic dysfunction (impaired relaxation).  2. Right ventricular systolic function is normal. The right ventricular size is normal.  3. The mitral valve is normal in structure. Trivial mitral valve regurgitation. No evidence of mitral stenosis.  4. The aortic valve is normal in structure. Aortic valve regurgitation is not visualized. No aortic stenosis is present.  5. The inferior vena cava is normal in size with greater than 50% respiratory variability, suggesting right atrial pressure of 3 mmHg. FINDINGS  Left Ventricle: Left ventricular ejection fraction, by estimation, is 60 to 65%. The left ventricle has normal function. The left ventricle has no regional wall motion abnormalities. The left ventricular internal cavity size was normal in size. There is  no left ventricular hypertrophy. Left ventricular diastolic  parameters are consistent with Grade I diastolic dysfunction (impaired relaxation). Right Ventricle: The right ventricular size is normal. No increase in right ventricular wall thickness. Right ventricular systolic function is normal. Left Atrium: Left atrial size was normal in size. Right Atrium: Right atrial size was normal in size. Pericardium: There is no evidence of pericardial effusion. Mitral Valve: The mitral valve is normal in structure. Trivial mitral valve regurgitation. No evidence of mitral valve stenosis. MV peak gradient, 4.7 mmHg. The mean mitral valve gradient is 2.0 mmHg. Tricuspid Valve: The tricuspid valve is normal in structure. Tricuspid valve regurgitation is not demonstrated. No evidence of tricuspid stenosis. Aortic Valve: The aortic valve is normal in structure. Aortic valve regurgitation is not visualized. No aortic stenosis is present. Aortic valve mean gradient measures 5.0 mmHg. Aortic valve peak gradient measures 8.8 mmHg. Aortic valve area, by VTI measures 1.62 cm. Pulmonic Valve: The pulmonic valve was normal in structure. Pulmonic valve  regurgitation is not visualized. No evidence of pulmonic stenosis. Aorta: The aortic root is normal in size and structure. Venous: The inferior vena cava is normal in size with greater than 50% respiratory variability, suggesting right atrial pressure of 3 mmHg. IAS/Shunts: No atrial level shunt detected by color flow Doppler.  LEFT VENTRICLE PLAX 2D LVIDd:         4.50 cm   Diastology LVIDs:         3.00 cm   LV e' medial:    7.83 cm/s LV PW:         1.00 cm   LV E/e' medial:  12.5 LV IVS:        1.00 cm   LV e' lateral:   7.94 cm/s LVOT diam:     1.80 cm   LV E/e' lateral: 12.3 LV SV:         55 LV SV Index:   30 LVOT Area:     2.54 cm  RIGHT VENTRICLE             IVC RV Basal diam:  2.50 cm     IVC diam: 0.70 cm RV S prime:     15.80 cm/s TAPSE (M-mode): 2.4 cm LEFT ATRIUM             Index        RIGHT ATRIUM           Index LA diam:         2.50 cm 1.37 cm/m   RA Area:     11.80 cm LA Vol (A2C):   39.0 ml 21.37 ml/m  RA Volume:   26.00 ml  14.25 ml/m LA Vol (A4C):   51.3 ml 28.11 ml/m LA Biplane Vol: 48.9 ml 26.79 ml/m  AORTIC VALVE AV Area (Vmax):    1.71 cm AV Area (Vmean):   1.63 cm AV Area (VTI):     1.62 cm AV Vmax:           148.00 cm/s AV Vmean:          101.000 cm/s AV VTI:            0.338 m AV Peak Grad:      8.8 mmHg AV Mean Grad:      5.0 mmHg LVOT Vmax:         99.70 cm/s LVOT Vmean:        64.500 cm/s LVOT VTI:          0.215 m LVOT/AV VTI ratio: 0.64  AORTA Ao Root diam: 2.50 cm Ao Asc diam:  2.70 cm MITRAL VALVE MV Area (PHT): 3.21 cm     SHUNTS MV Area VTI:   1.76 cm     Systemic VTI:  0.22 m MV Peak grad:  4.7 mmHg     Systemic Diam: 1.80 cm MV Mean grad:  2.0 mmHg MV Vmax:       1.08 m/s MV Vmean:      58.6 cm/s MV Decel Time: 236 msec MV E velocity: 97.90 cm/s MV A velocity: 101.00 cm/s MV E/A ratio:  0.97 Candee Furbish MD Electronically signed by Candee Furbish MD Signature Date/Time: 11/21/2021/11:21:39 AM    Final

## 2021-12-13 IMAGING — MG MM DIGITAL DIAGNOSTIC UNILAT*L* W/ TOMO W/ CAD
4 series · 4 of 12 positions shown · non-contrast
Comparison: Previous exam(s).

CLINICAL DATA: Patient reported a left breast lump, upper outer,
which she no longer palpates. No other complaints.

EXAM:
DIGITAL DIAGNOSTIC UNILATERAL LEFT MAMMOGRAM WITH CAD AND TOMO

[L MLO synth-2D]
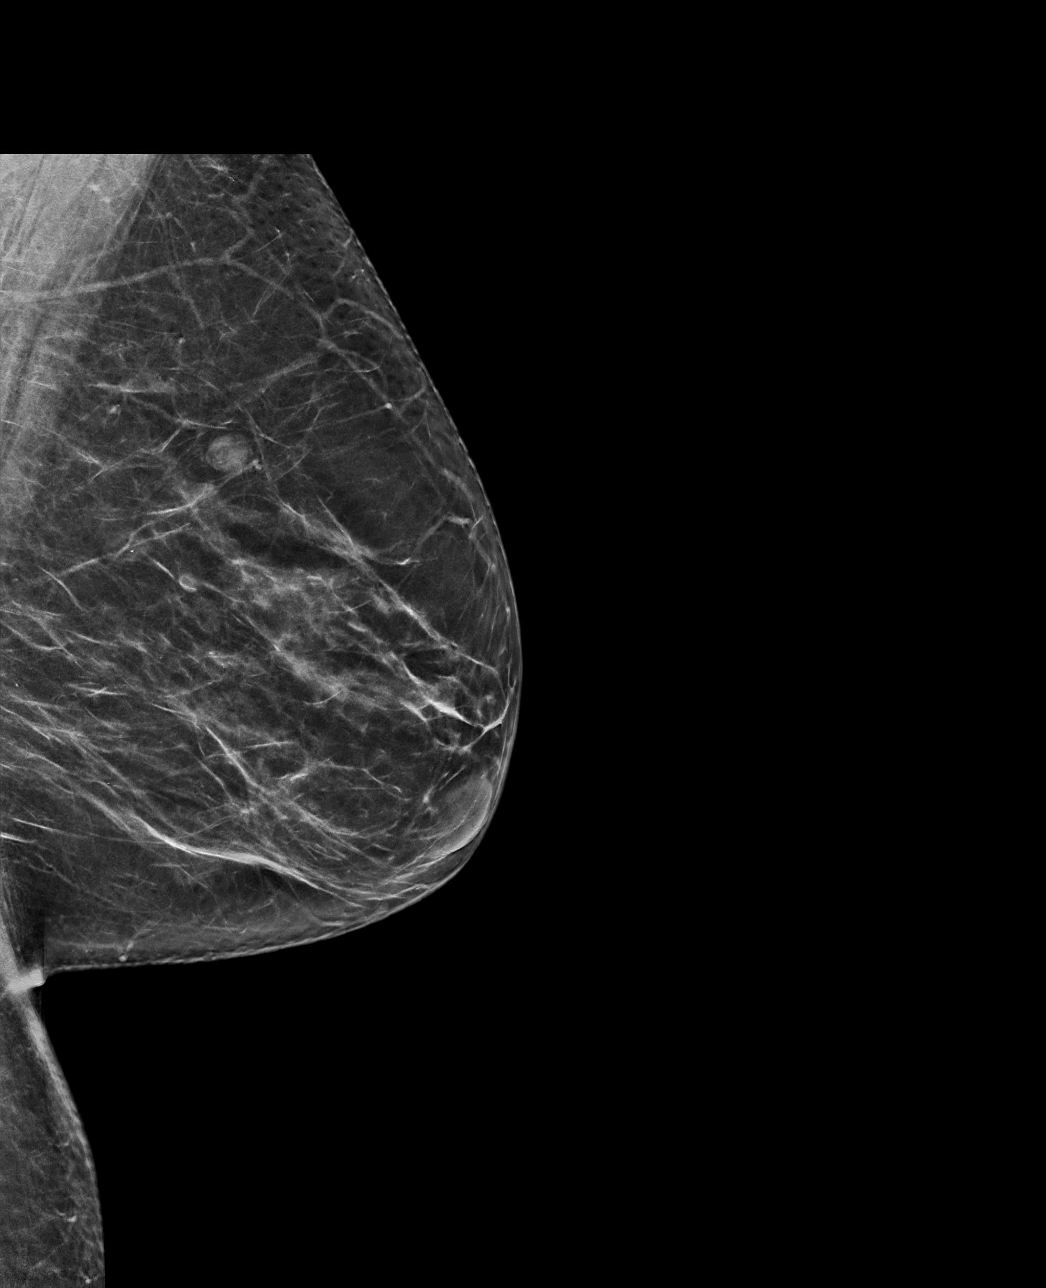

[L CC synth-2D]
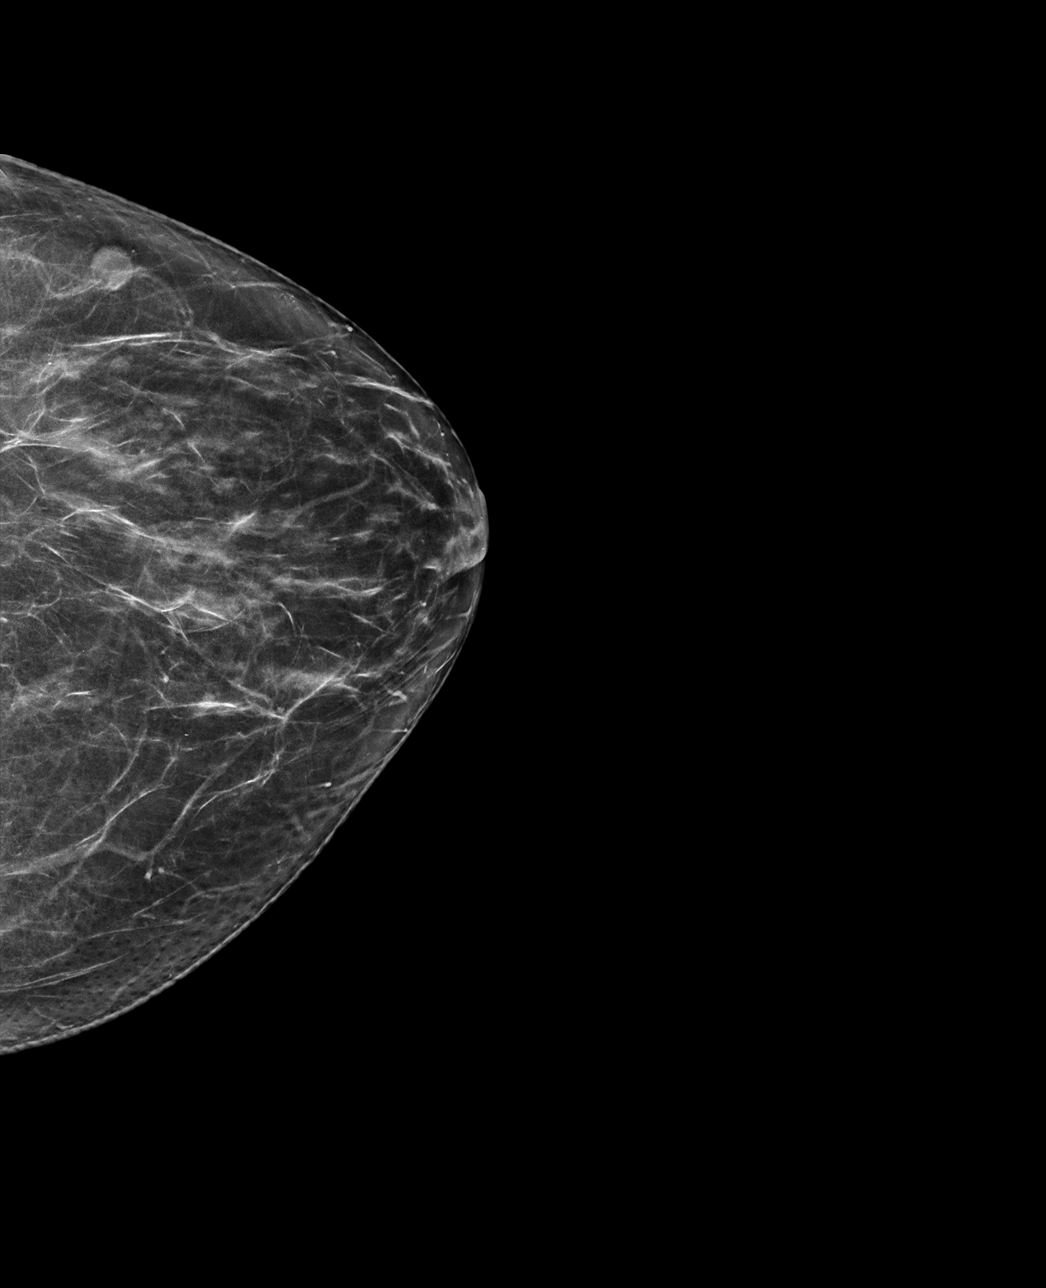

[L CC tomo · tomo slice 35/69.0]
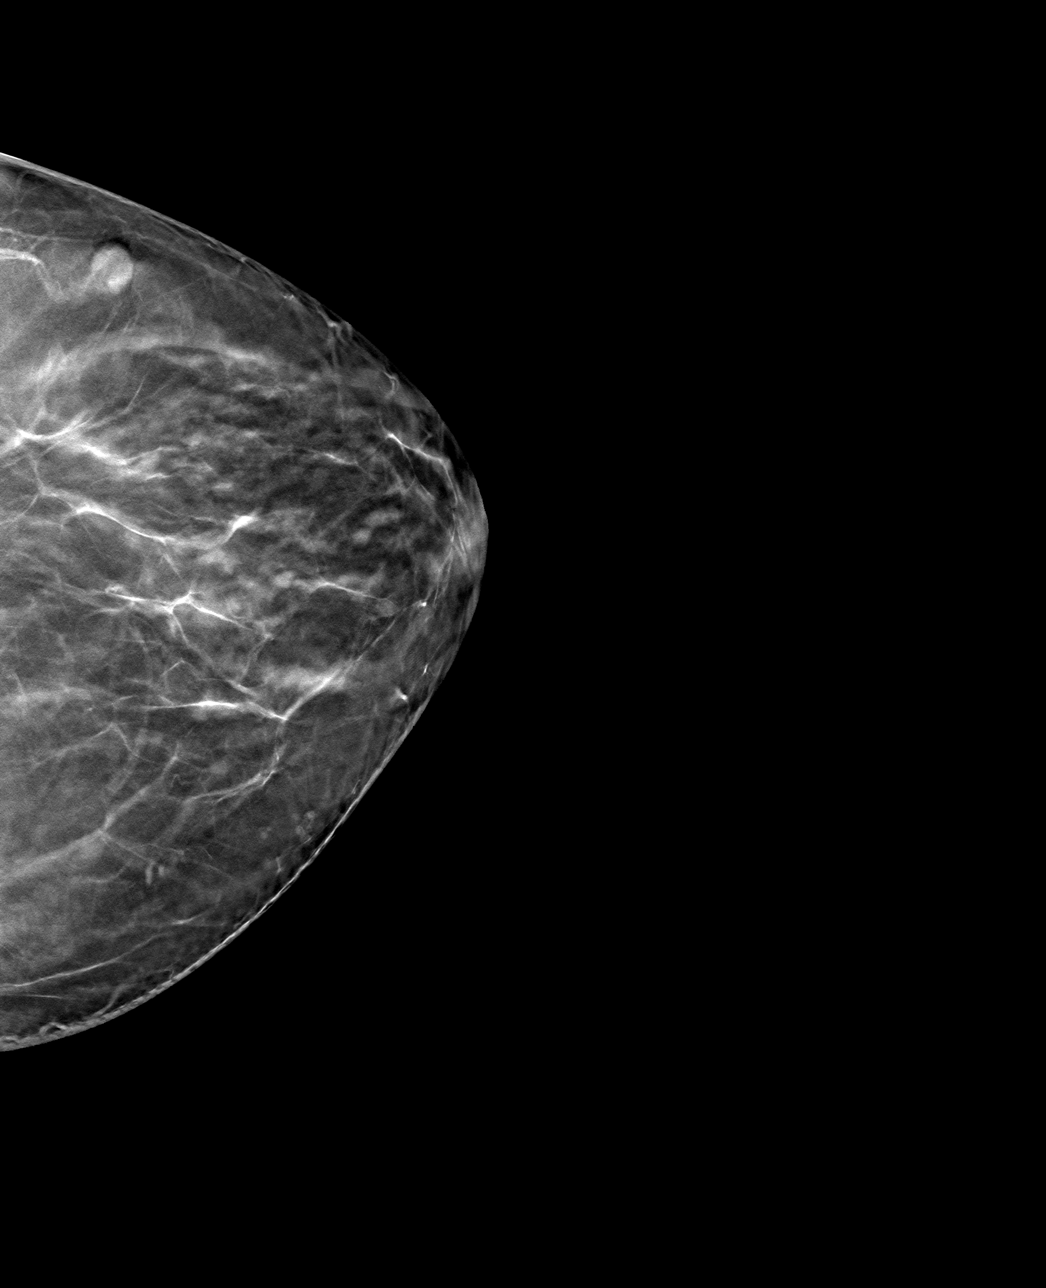

[L MLO tomo · tomo slice 36/71.0]
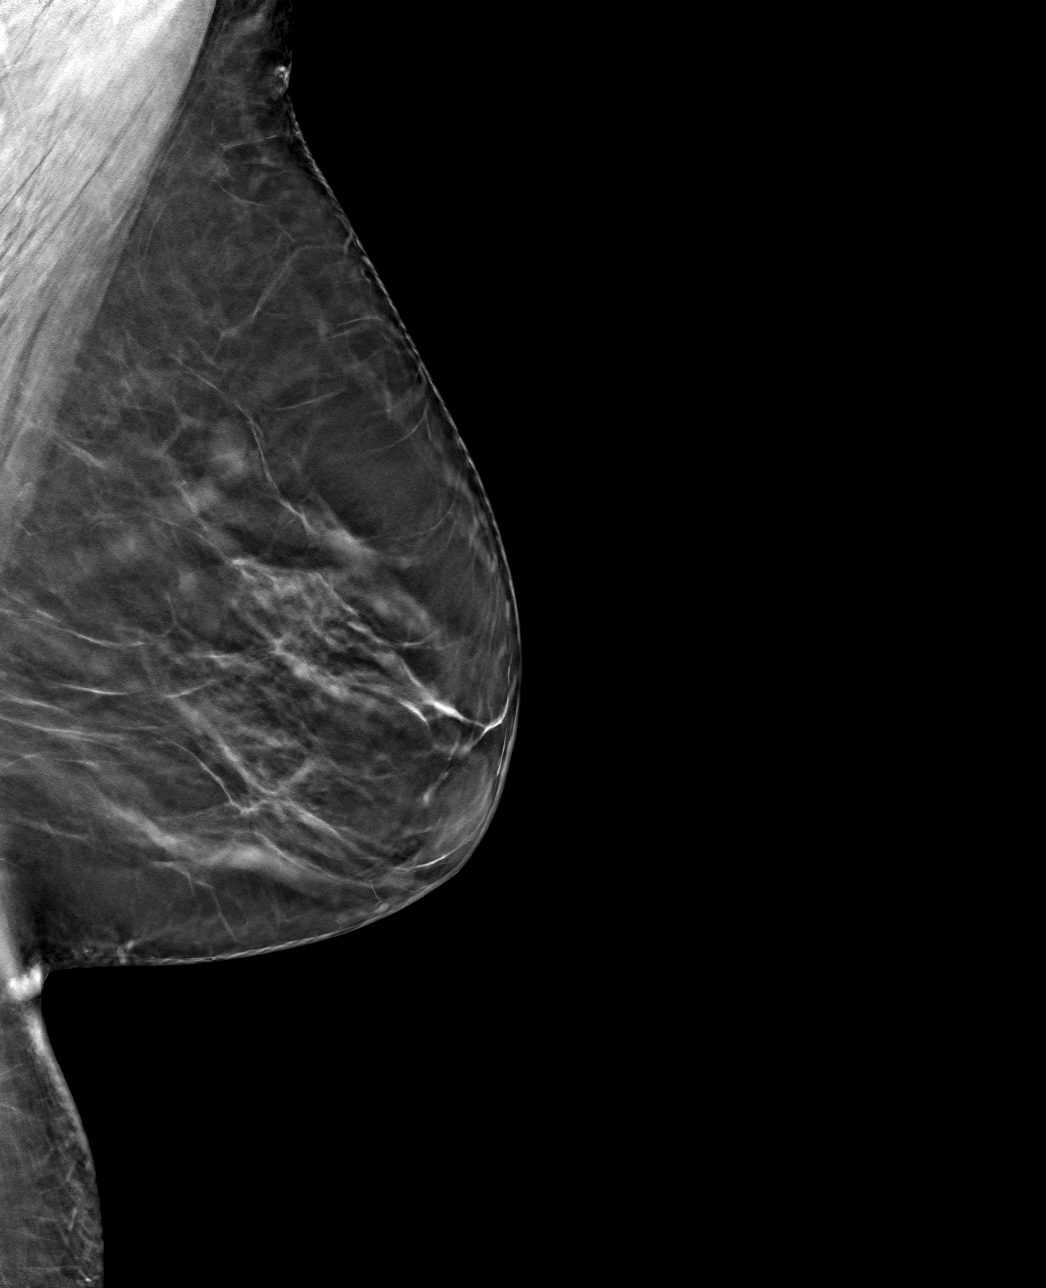

[4 of 12 positions shown; findings below may reference images not displayed]

ACR Breast Density Category b: There are scattered areas of
fibroglandular density.
FINDINGS: There are no suspicious masses, areas of architectural distortion,
areas of significant asymmetry or suspicious calcifications. There
has been no mammographic change.

Mammographic images were processed with CAD.
IMPRESSION: Negative exam, no evidence of breast malignancy.

RECOMMENDATION:
Screening mammogram in April 2020, last screening study dated
04/17/2019.(Code:XG-[id number])

I have discussed the findings and recommendations with the patient.
If applicable, a reminder letter will be sent to the patient
regarding the next appointment.

BI-RADS CATEGORY  1: Negative.

## 2021-12-14 ENCOUNTER — Other Ambulatory Visit: Payer: Self-pay | Admitting: Family Medicine

## 2021-12-20 ENCOUNTER — Other Ambulatory Visit: Payer: Self-pay | Admitting: Family Medicine

## 2022-01-28 ENCOUNTER — Encounter: Payer: Self-pay | Admitting: *Deleted

## 2022-01-28 NOTE — Congregational Nurse Program (Signed)
Pt reports concerns with having high blood sugar numbers in the morning. This morning her blood sugar was 241. She reports eating dinner around 9pm, stating "my eating habits are horrible". We discussed starting with substituting one meal a day that's diabetic friendly decreasing to one cup of coffee. She also states she wants to stop smoking. I will provide some written education on diabetic diet and smoking cessation. [employer]  ?

## 2022-01-28 NOTE — Congregational Nurse Program (Unsigned)
See printed education ?

## 2022-02-21 ENCOUNTER — Ambulatory Visit (INDEPENDENT_AMBULATORY_CARE_PROVIDER_SITE_OTHER): Payer: Self-pay

## 2022-02-21 DIAGNOSIS — Z111 Encounter for screening for respiratory tuberculosis: Secondary | ICD-10-CM

## 2022-02-21 NOTE — Progress Notes (Signed)
Tuberculin skin test applied to left ventral forearm.

## 2022-02-23 ENCOUNTER — Ambulatory Visit: Payer: Self-pay

## 2022-02-23 DIAGNOSIS — Z111 Encounter for screening for respiratory tuberculosis: Secondary | ICD-10-CM

## 2022-02-23 LAB — TB SKIN TEST
Induration: 0 mm
TB Skin Test: NEGATIVE

## 2022-02-23 NOTE — Progress Notes (Signed)
PPD Reading Note  PPD read and results entered in [employer].  Result: 0 mm induration.  Interpretation: Negative  Allergic reaction: no

## 2022-02-27 ENCOUNTER — Ambulatory Visit: Payer: BC Managed Care – PPO | Admitting: Family Medicine

## 2022-03-01 LAB — HM DIABETES EYE EXAM

## 2022-03-08 ENCOUNTER — Encounter: Payer: Self-pay | Admitting: Family Medicine

## 2022-03-08 NOTE — Progress Notes (Signed)
DM eye exam completed on 4.27.23 with Dr. Alois Cliche OD PA ?

## 2022-03-20 ENCOUNTER — Encounter: Payer: Self-pay | Admitting: Family Medicine

## 2022-03-20 ENCOUNTER — Ambulatory Visit (INDEPENDENT_AMBULATORY_CARE_PROVIDER_SITE_OTHER): Payer: No Typology Code available for payment source | Admitting: Family Medicine

## 2022-03-20 VITALS — BP 118/53 | HR 64 | Ht 65.0 in | Wt 158.4 lb

## 2022-03-20 DIAGNOSIS — R81 Glycosuria: Secondary | ICD-10-CM

## 2022-03-20 DIAGNOSIS — R739 Hyperglycemia, unspecified: Secondary | ICD-10-CM | POA: Diagnosis not present

## 2022-03-20 DIAGNOSIS — B3731 Acute candidiasis of vulva and vagina: Secondary | ICD-10-CM | POA: Insufficient documentation

## 2022-03-20 DIAGNOSIS — E119 Type 2 diabetes mellitus without complications: Secondary | ICD-10-CM | POA: Diagnosis not present

## 2022-03-20 DIAGNOSIS — N898 Other specified noninflammatory disorders of vagina: Secondary | ICD-10-CM | POA: Diagnosis not present

## 2022-03-20 LAB — POCT URINALYSIS DIP (MANUAL ENTRY)
Bilirubin, UA: NEGATIVE
Blood, UA: NEGATIVE
Glucose, UA: >=1000 mg/dL — AB
Ketones, POC UA: NEGATIVE mg/dL
Leukocytes, UA: NEGATIVE
Nitrite, UA: NEGATIVE
Protein Ur, POC: NEGATIVE mg/dL
Spec Grav, UA: <=1.005 — AB (ref 1.010–1.025)
Urobilinogen, UA: 0.2 E.U./dL
pH, UA: 5 (ref 5.0–8.0)

## 2022-03-20 LAB — POCT GLYCOSYLATED HEMOGLOBIN (HGB A1C): HbA1c, POC (controlled diabetic range): 10.8 % — AB (ref 0.0–7.0)

## 2022-03-20 LAB — POCT UA - MICROSCOPIC ONLY: WBC, Ur, HPF, POC: NONE SEEN (ref 0–5)

## 2022-03-20 LAB — POCT WET PREP (WET MOUNT)
Clue Cells Wet Prep Whiff POC: NEGATIVE
Trichomonas Wet Prep HPF POC: ABSENT

## 2022-03-20 MED ORDER — FREESTYLE INSULINX TEST VI STRP: ORAL_STRIP | 12 refills | Status: AC

## 2022-03-20 MED ORDER — FLUCONAZOLE 150 MG PO TABS: 150.0000 mg | ORAL_TABLET | Freq: Once | ORAL | 0 refills | Status: AC

## 2022-03-20 MED ORDER — FREESTYLE LANCETS MISC: 12 refills | Status: AC

## 2022-03-20 MED ORDER — METFORMIN HCL ER 500 MG PO TB24: 1000.0000 mg | ORAL_TABLET | Freq: Every day | ORAL | 1 refills | Status: DC

## 2022-03-20 NOTE — Progress Notes (Signed)
? ? ?  SUBJECTIVE:  ? ?CHIEF COMPLAINT / HPI:  ? ?HPI ?UTI symptoms: ?The patient presents with UTI concerns. Over the past week, she had experienced an itchy vulva and vaginal odor. She denies dysuria, change in urine color, or blood in her urine. She denies any GI symptoms and no fever.  ?She has a hx of a previous UTI, which is different from her current symptoms. Not sexually active. ? ?DM2:  ?She is compliant with her Metformin XL 500 mg QD. She need a refill of her glucometer test strip and lancets. ? ?PERTINENT  PMH / PSH: PMHx reviewed ? ?OBJECTIVE:  ? ?BP (!) 118/53   Pulse 64   Ht '5\' 5"'$  (1.651 m)   Wt 158 lb 6.4 oz (71.8 kg)   SpO2 99%   BMI 26.36 kg/m?   ?Physical Exam ?Vitals and nursing note reviewed. Exam conducted with a chaperone present Lavell Anchors).  ?Genitourinary: ?   General: Normal vulva.  ?   Labia:     ?   Right: No rash, tenderness or lesion.     ?   Left: No rash, tenderness or lesion.   ?   Vagina: Vaginal discharge present. No tenderness or bleeding.  ?   Cervix: Discharge present. No friability.  ?Neurological:  ?   Mental Status: She is alert.  ? ? ? ?ASSESSMENT/PLAN:  ? ?DM (diabetes mellitus), type 2 ([place]) ?A1C checked today and it increased from 6.7 to 10.8 ?She endorses compliance with her Metformin with occasional missed dose. ?She recently started drinking a nutrition drink one bottle per day - uncertain the food/calorie content. ?I discussed increasing her Metformin to 1000 mg QD and escribed her new dose. ?I also escribed her glucometer test strips and lancet. ?I advised her to stop this nutrition drink for now and encouraged eating healthy 3 quare meal. ?Nutrition referral for counseling offered, but she declined. ?F/U in 3 months for reassessment.  ? ?Yeast vaginitis ?In the settings of uncontrolled DM2 ?Wet prep was completed today and it confirmed yeast infection. ?I called her after visit and discussed this result with her. ?Treat with one dose of Diflucan. ?I  advised her to contact me soon if her symptoms persists. ?She agreed with the plan.  ?  ?Glucosuria: ?UA was pending when I called her with her wet prep result. ?I did advised her that she might have glucose in her urine due to her A1C report. ?In that case, Metformin dose increase should address that. ?I advised, I would not need to call her for urine result, unless it shows signs of infection. ?She verbalized understanding.  ? ?Andrena Mews, MD ?[place]  ? ? ? ?

## 2022-03-20 NOTE — Assessment & Plan Note (Addendum)
A1C checked today and it increased from 6.7 to 10.8 ?She endorses compliance with her Metformin with occasional missed dose. ?She recently started drinking a nutrition drink one bottle per day - uncertain the food/calorie content. ?I discussed increasing her Metformin to 1000 mg QD and escribed her new dose. ?I also escribed her glucometer test strips and lancet. ?I advised her to stop this nutrition drink for now and encouraged eating healthy 3 quare meal. ?Nutrition referral for counseling offered, but she declined. ?F/U in 3 months for reassessment.  ?

## 2022-03-20 NOTE — Assessment & Plan Note (Signed)
In the settings of uncontrolled DM2 ?Wet prep was completed today and it confirmed yeast infection. ?I called her after visit and discussed this result with her. ?Treat with one dose of Diflucan. ?I advised her to contact me soon if her symptoms persists. ?She agreed with the plan.  ?

## 2022-03-20 NOTE — Patient Instructions (Signed)
Vaginitis  Vaginitis is irritation and swelling of the vagina. Treatment will depend on the cause. What are the causes? It can be caused by: Bacteria. Yeast. A parasite. A virus. Low hormone levels. Bubble baths, scented tampons, and feminine sprays. Other things can change the balance of the yeast and bacteria that live in the vagina. These include: Antibiotic medicines. Not being clean enough. Some birth control methods. Sex. Infection. Diabetes. A weakened body defense system (immune system). What increases the risk? Smoking or being around someone who smokes. Using washes (douches), scented tampons, or scented pads. Wearing tight pants or thong underwear. Using birth control pills or an IUD. Having sex without a condom or having a lot of partners. Having an STI. Using a certain product to kill sperm (nonoxynol-9). Eating foods that are high in sugar. Having diabetes. Having low levels of a female hormone. Having a weakened body defense system. Being pregnant or breastfeeding. What are the signs or symptoms? Fluid coming from the vagina that is not normal. A bad smell. Itching, pain, or swelling. Pain with sex. Pain or burning when you pee (urinate). Sometimes there are no symptoms. How is this treated? Treatment may include: Antibiotic creams or pills. Antifungal medicines. Medicines to ease symptoms if you have a virus. Your sex partner should also be treated. Estrogen medicines. Avoiding scented soaps, sprays, or douches. Stopping use of products that caused irritation and then using a cream to treat symptoms. Follow these instructions at home: Lifestyle Keep the area around your vagina clean and dry. Avoid using soap. Rinse the area with water. Until your doctor says it is okay: Do not use washes for the vagina. Do not use tampons. Do not have sex. Wipe from front to back after going to the bathroom. When your doctor says it is okay, practice safe sex  and use condoms. General instructions Take over-the-counter and prescription medicines only as told by your doctor. If you were prescribed an antibiotic medicine, take or use it as told by your doctor. Do not stop taking or using it even if you start to feel better. Keep all follow-up visits. How is this prevented? Do not use things that can irritate the vagina, such as fabric softeners. Avoid these products if they are scented: Sprays. Detergents. Tampons. Products for cleaning the vagina. Soaps or bubble baths. Let air reach your vagina. To do this: Wear cotton underwear. Do not wear: Underwear while you sleep. Tight pants. Thong underwear. Underwear or nylons without a cotton panel. Take off any wet clothing, such as bathing suits, as soon as you can. Practice safe sex and use condoms. Contact a doctor if: You have pain in your belly or in the area between your hips. You have a fever or chills. Your symptoms last for more than 2-3 days. Get help right away if: You have a fever and your symptoms get worse all of a sudden. Summary Vaginitis is irritation and swelling of the vagina. Treatment will depend on the cause of the condition. Do not use washes or tampons or have sex until your doctor says it is okay. This information is not intended to replace advice given to you by your health care provider. Make sure you discuss any questions you have with your health care provider. Document Revised: 04/21/2020 Document Reviewed: 04/21/2020 [employer] Patient Education  2023 [employer]  

## 2022-04-10 ENCOUNTER — Encounter: Payer: Self-pay | Admitting: *Deleted

## 2022-06-12 ENCOUNTER — Other Ambulatory Visit: Payer: Self-pay | Admitting: Family Medicine

## 2022-06-12 DIAGNOSIS — Z1231 Encounter for screening mammogram for malignant neoplasm of breast: Secondary | ICD-10-CM

## 2022-06-26 ENCOUNTER — Encounter (HOSPITAL_COMMUNITY): Payer: Self-pay

## 2022-06-26 ENCOUNTER — Ambulatory Visit (INDEPENDENT_AMBULATORY_CARE_PROVIDER_SITE_OTHER): Payer: No Typology Code available for payment source

## 2022-06-26 ENCOUNTER — Ambulatory Visit
Admission: RE | Admit: 2022-06-26 | Discharge: 2022-06-26 | Disposition: A | Discharge status: Home / Self Care | Payer: No Typology Code available for payment source | Source: Ambulatory Visit | Attending: Family Medicine | Admitting: Family Medicine

## 2022-06-26 ENCOUNTER — Ambulatory Visit (HOSPITAL_COMMUNITY)
Admission: EM | Admit: 2022-06-26 | Discharge: 2022-06-26 | Disposition: A | Discharge status: Home / Self Care | Payer: No Typology Code available for payment source | Attending: Physician Assistant | Admitting: Physician Assistant

## 2022-06-26 DIAGNOSIS — Z1231 Encounter for screening mammogram for malignant neoplasm of breast: Secondary | ICD-10-CM

## 2022-06-26 DIAGNOSIS — S7002XA Contusion of left hip, initial encounter: Secondary | ICD-10-CM | POA: Diagnosis not present

## 2022-06-26 DIAGNOSIS — W19XXXA Unspecified fall, initial encounter: Secondary | ICD-10-CM | POA: Diagnosis not present

## 2022-06-26 MED ORDER — NAPROXEN 500 MG PO TABS: 500.0000 mg | ORAL_TABLET | Freq: Two times a day (BID) | ORAL | 0 refills | Status: DC

## 2022-06-26 NOTE — ED Provider Notes (Signed)
[place]    CSN: [id number] Arrival date & time: 06/26/22  1809      History   Chief Complaint Chief Complaint  Patient presents with   Fall    HPI Danielle Mccann is a 61 y.o. female.   Patient presents today for evaluation of left hip pain following injury.  Reports that approximately 3 days ago she was coming out of the bathroom at a restaurant when she missed a step and fell forward with the majority of her weight landing on her left side against a wall.  She reports that she injured her elbow but this has been improving.  Today her primary concern is left hip pain.  She has minimal pain at rest but this increases to 7 with attempted ambulation, localized to lateral left hip, described as sharp, worse with ambulation, she has tried Tylenol and Aspercreme with improvement but not resolution of symptoms.  She denies any back pain, bowel/bladder incontinence, lower extremity weakness.  She is able to bear weight and ambulate unassisted though it is painful.  She is having difficulty with daily duties as result of symptoms.  She denies any head injury, loss of consciousness, headache, dizziness, nausea, vomiting, amnesia surrounding event.    Past Medical History:  Diagnosis Date   Breast lump 12/18/2019   Diabetes mellitus without complication ([place])    Paresthesia 09/24/2017   Skin lesion 11/16/2011   Skin tag 06/19/2017   Smoking 1/2 pack a day or less    Smoking 1/2 pack a day or less     Patient Active Problem List   Diagnosis Date Noted   Yeast vaginitis 03/20/2022   Influenza vaccination declined by patient 10/03/2020   Hypertriglyceridemia 08/15/2018   GAD (generalized anxiety disorder) 01/04/2017   DM (diabetes mellitus), type 2 ([place]) 07/20/2016   Menopause 09/12/2010   Overweight 04/07/2008    Past Surgical History:  Procedure Laterality Date   NO PAST SURGERIES      OB History   No obstetric history on file.      Home Medications     Prior to Admission medications   Medication Sig Start Date End Date Taking? Authorizing Provider  naproxen (NAPROSYN) 500 MG tablet Take 1 tablet (500 mg total) by mouth 2 (two) times daily. 06/26/22  Yes Casha Estupinan K, PA-C  atorvastatin (LIPITOR) 10 MG tablet TAKE 1 TABLET(10 MG) BY MOUTH 2 TIMES A WEEK 09/18/21   Eniola, Tawanna Solo T, MD  escitalopram (LEXAPRO) 20 MG tablet TAKE 1 TABLET(20 MG) BY MOUTH DAILY 12/14/21   Andrena Mews T, MD  glucose blood (FREESTYLE INSULINX TEST) test strip Use to check glucose once daily 03/20/22   Andrena Mews T, MD  glucose monitoring kit (FREESTYLE) monitoring kit Use to check glucose once daily 07/20/16   Andrena Mews T, MD  Lancets (FREESTYLE) lancets Use to check glucose once daily 03/20/22   Kinnie Feil, MD  metFORMIN (GLUCOPHAGE-XR) 500 MG 24 hr tablet Take 2 tablets (1,000 mg total) by mouth daily with breakfast. 03/20/22   Kinnie Feil, MD  Omega-3 Fatty Acids (FISH OIL) 1000 MG CAPS Take 1 capsule (1,000 mg total) by mouth 2 (two) times daily. 04/05/18   Kinnie Feil, MD    Family History Family History  Problem Relation Age of Onset   Stroke Mother    Diabetes Mother    Hypertension Mother    Alcohol abuse Father    Diabetes Father    Hypertension Father  Heart disease Neg Hx    Cancer Neg Hx    Colon cancer Neg Hx     Social History Social History   Tobacco Use   Smoking status: Former    Packs/day: 0.50    Types: Cigarettes    Quit date: 11/28/2018    Years since quitting: 3.5   Smokeless tobacco: Never  Substance Use Topics   Alcohol use: No    Alcohol/week: 0.0 standard drinks of alcohol   Drug use: No     Allergies   Patient has no known allergies.   Review of Systems Review of Systems  Constitutional:  Positive for activity change. Negative for appetite change, fatigue and fever.  Gastrointestinal:  Negative for abdominal pain, diarrhea, nausea and vomiting.  Musculoskeletal:  Positive for  arthralgias and gait problem. Negative for back pain, joint swelling and myalgias.  Neurological:  Negative for dizziness, weakness, light-headedness, numbness and headaches.     Physical Exam Triage Vital Signs ED Triage Vitals  Enc Vitals Group     BP 06/26/22 1850 122/71     Pulse Rate 06/26/22 1850 95     Resp 06/26/22 1850 12     Temp 06/26/22 1850 98.6 F (37 C)     Temp Source 06/26/22 1850 Oral     SpO2 06/26/22 1850 98 %     Weight 06/26/22 1848 158 lb 3.2 oz (71.8 kg)     Height 06/26/22 1848 '5\' 4"'  (1.626 m)     Head Circumference --      Peak Flow --      Pain Score 06/26/22 1846 7     Pain Loc --      Pain Edu? --      Excl. in [place]? --    No data found.  Updated Vital Signs BP 122/71 (BP Location: Right Arm)   Pulse 95   Temp 98.6 F (37 C) (Oral)   Resp 12   Ht '5\' 4"'  (1.626 m)   Wt 158 lb 3.2 oz (71.8 kg)   SpO2 98%   BMI 27.15 kg/m   Visual Acuity Right Eye Distance:   Left Eye Distance:   Bilateral Distance:    Right Eye Near:   Left Eye Near:    Bilateral Near:     Physical Exam Vitals reviewed.  Constitutional:      General: She is awake. She is not in acute distress.    Appearance: Normal appearance. She is well-developed. She is not ill-appearing.     Comments: Very pleasant female appears stated age in no acute distress sitting comfortably in exam room  HENT:     Head: Normocephalic and atraumatic.  Cardiovascular:     Rate and Rhythm: Normal rate and regular rhythm.     Heart sounds: Normal heart sounds, S1 normal and S2 normal. No murmur heard. Pulmonary:     Effort: Pulmonary effort is normal.     Breath sounds: Normal breath sounds. No wheezing, rhonchi or rales.     Comments: Clear to auscultation bilaterally Musculoskeletal:     Left hip: Tenderness present. No deformity or bony tenderness. Normal range of motion. Normal strength.       Legs:     Comments: Strength 5/5 bilateral lower extremities.  Tenderness palpation over  lateral left hip without deformity.  Psychiatric:        Behavior: Behavior is cooperative.      UC Treatments / Results  Labs (all labs ordered are listed, but  only abnormal results are displayed) Labs Reviewed - No data to display  EKG   Radiology DG Hip Unilat With Pelvis 2-3 Views Left  Result Date: 06/26/2022 CLINICAL DATA:  Right hip pain after fall EXAM: DG HIP (WITH OR WITHOUT PELVIS) 2-3V LEFT COMPARISON:  None Available. FINDINGS: There is no evidence of hip fracture or dislocation. Mild degenerative arthritis both hips. No soft tissue abnormality. IMPRESSION: No acute fracture. Electronically Signed   By: Placido Sou M.D.   On: 06/26/2022 19:29   MM 3D SCREEN BREAST BILATERAL  Result Date: 06/26/2022 CLINICAL DATA:  Screening. EXAM: DIGITAL SCREENING BILATERAL MAMMOGRAM WITH TOMOSYNTHESIS AND CAD TECHNIQUE: Bilateral screening digital craniocaudal and mediolateral oblique mammograms were obtained. Bilateral screening digital breast tomosynthesis was performed. The images were evaluated with computer-aided detection. COMPARISON:  Previous exam(s). ACR Breast Density Category b: There are scattered areas of fibroglandular density. FINDINGS: There are no findings suspicious for malignancy. IMPRESSION: No mammographic evidence of malignancy. A result letter of this screening mammogram will be mailed directly to the patient. RECOMMENDATION: Screening mammogram in one year. (Code:SM-B-01Y) BI-RADS CATEGORY  1: Negative. Electronically Signed   By: Dorise Bullion III M.D.   On: 06/26/2022 15:42    Procedures Procedures (including critical care time)  Medications Ordered in UC Medications - No data to display  Initial Impression / Assessment and Plan / UC Course  I have reviewed the triage vital signs and the nursing notes.  Pertinent labs & imaging results that were available during my care of the patient were reviewed by me and considered in my medical decision making (see  chart for details).     X-ray obtained given mechanism of injury showed no osseous abnormalities.  Suspect contusion as etiology of symptoms.  Recommended conservative treatment measures including heat, massage, rest.  Patient was encouraged to continue Tylenol and topical medications.  We will add Naprosyn.  Discussed that she should not take this for a long period of time as it increases the risk of bleeding particularly with SSRI use.  She is not to take additional NSAIDs with this medication.  If her symptoms are not improving quickly she is to follow-up with sports medicine and was given contact information for local provider with instruction to call to schedule an appointment.  If she has any worsening symptoms including increased pain, difficulty ambulating, weakness, numbness, paresthesias that she is to be seen immediately to which she expressed understanding.  Strict return precautions given.  Work excuse note provided.  Final Clinical Impressions(s) / UC Diagnoses   Final diagnoses:  Contusion of left hip, initial encounter  Fall, initial encounter     Discharge Instructions      Your x-ray was normal.  I believe you have a contusion.  Use a heating pad and gentle stretch for symptom relief.  I have called in Naprosyn for pain relief.  You should take this for as brief period of time as possible.  Do not take any NSAIDs with this medication including aspirin, ibuprofen/Advil, naproxen/Aleve.  You can use Tylenol/acetaminophen for additional symptom relief.  If you have any worsening symptoms including difficulty walking, numbness, swelling, increased pain you need to be seen immediately.  If your symptoms do not improving quickly please follow-up with a sports medicine provider; call to schedule an appointment.     ED Prescriptions     Medication Sig Dispense Auth. Provider   naproxen (NAPROSYN) 500 MG tablet Take 1 tablet (500 mg total) by mouth 2 (two)  times daily. 20 tablet  Moishe Schellenberg, Derry Skill, PA-C      PDMP not reviewed this encounter.   Terrilee Croak, PA-C 06/26/22 1946

## 2022-06-26 NOTE — Discharge Instructions (Signed)
Your x-ray was normal.  I believe you have a contusion.  Use a heating pad and gentle stretch for symptom relief.  I have called in Naprosyn for pain relief.  You should take this for as brief period of time as possible.  Do not take any NSAIDs with this medication including aspirin, ibuprofen/Advil, naproxen/Aleve.  You can use Tylenol/acetaminophen for additional symptom relief.  If you have any worsening symptoms including difficulty walking, numbness, swelling, increased pain you need to be seen immediately.  If your symptoms do not improving quickly please follow-up with a sports medicine provider; call to schedule an appointment.

## 2022-07-05 ENCOUNTER — Other Ambulatory Visit: Payer: Self-pay | Admitting: Family Medicine

## 2022-07-31 ENCOUNTER — Ambulatory Visit: Payer: No Typology Code available for payment source | Admitting: Family Medicine

## 2022-08-14 ENCOUNTER — Ambulatory Visit (INDEPENDENT_AMBULATORY_CARE_PROVIDER_SITE_OTHER): Payer: No Typology Code available for payment source | Admitting: Family Medicine

## 2022-08-14 ENCOUNTER — Telehealth: Payer: Self-pay | Admitting: Family Medicine

## 2022-08-14 ENCOUNTER — Encounter: Payer: Self-pay | Admitting: Family Medicine

## 2022-08-14 VITALS — BP 124/59 | HR 62 | Ht 64.0 in | Wt 157.6 lb

## 2022-08-14 DIAGNOSIS — Z0000 Encounter for general adult medical examination without abnormal findings: Secondary | ICD-10-CM

## 2022-08-14 DIAGNOSIS — Z122 Encounter for screening for malignant neoplasm of respiratory organs: Secondary | ICD-10-CM | POA: Diagnosis not present

## 2022-08-14 DIAGNOSIS — E119 Type 2 diabetes mellitus without complications: Secondary | ICD-10-CM | POA: Diagnosis not present

## 2022-08-14 LAB — POCT GLYCOSYLATED HEMOGLOBIN (HGB A1C): HbA1c, POC (controlled diabetic range): 6.7 % (ref 0.0–7.0)

## 2022-08-14 NOTE — Patient Instructions (Signed)
Lung Cancer Screening A lung cancer screening is a test that checks for lung cancer when there are no symptoms or history of that disease. The screening is done to look for lung cancer in its very early stages. Finding cancer early improves the chances of successful treatment. It may save your life. Who should have a screening? You should be screened for lung cancer if all of these apply: You currently smoke, or you have quit smoking within the past 15 years. You are between the ages of 50 and 80 years old. Screening may be recommended up to age 80 depending on your overall health and other factors. You have a smoking history of 1 pack of cigarettes a day for 20 years or 2 packs a day for 10 years. How is screening done?  The recommended screening test is a low-dose computed tomography (LDCT) scan. This scan takes detailed images of the lungs. This allows a health care provider to look for abnormal cells. If you are at risk for lung cancer, it is recommended that you get screened once a year. Talk to your health care provider about the risks, benefits, and limitations of screening. What are the benefits of screening? Screening can find lung cancer early, before symptoms start and before it has spread outside of the lungs. The chances of curing lung cancer are greater if the cancer is diagnosed early. What are the risks of screening? The screening may show lung cancer when no cancer is present. Talk with your health care provider about what your results mean. In some cases, your health care provider may do more testing to confirm the results. The screening may not find lung cancer when it is present. You will be exposed to radiation from repeated LDCT tests, which can cause cancer in otherwise healthy people. How can I lower my risk of lung cancer? Make these lifestyle changes to lower your risk of developing lung cancer: Do not use any products that contain nicotine or tobacco. These products  include cigarettes, chewing tobacco, and vaping devices, such as e-cigarettes. If you need help quitting, ask your health care provider. Avoid secondhand smoke. Avoid exposure to radiation. Avoid exposure to radon gas. Have your home checked for radon regularly. Avoid things that cause cancer (carcinogens). Avoid living or working in places with high air pollution or diesel exhaust. Questions to ask your health care provider Am I eligible for lung cancer screening? Does my health insurance cover the cost of lung cancer screening? What happens if the lung cancer screening shows something of concern? How soon will I have results from my lung cancer screening? Is there anything that I need to do to prepare for my lung cancer screening? What happens if I decide not to have lung cancer screening? Where to find more information Ask your health care provider about the risks and benefits of screening. More information and resources are available from these organizations: American Cancer Society (ACS): www.cancer.org American Lung Association: www.lung.org National Cancer Institute: www.cancer.gov Contact a health care provider if: You start to show symptoms of lung cancer, including: A cough that will not go away. High-pitched whistling sounds when you breathe, most often when you breathe out (wheezing). Chest pain. Coughing up blood. Shortness of breath. Weight loss that cannot be explained. Constant tiredness (fatigue). Hoarse voice. Summary Lung cancer screening may find lung cancer before symptoms appear. Finding cancer early improves the chances of successful treatment. It may save your life. The recommended screening test is a low-dose   computed tomography (LDCT) scan that looks for abnormal cells in the lungs. If you are at risk for lung cancer, it is recommended that you get screened once a year. You can make lifestyle changes to lower your risk of lung cancer. Ask your health care  provider about the risks and benefits of screening. This information is not intended to replace advice given to you by your health care provider. Make sure you discuss any questions you have with your health care provider. Document Revised: 04/12/2021 Document Reviewed: 04/12/2021 Elsevier Patient Education  2023 Elsevier Inc.  

## 2022-08-14 NOTE — Telephone Encounter (Signed)
NA

## 2022-08-14 NOTE — Progress Notes (Signed)
SUBJECTIVE:   Chief compliant/HPI: annual examination  Danielle Mccann is a 61 y.o. who presents today for an annual exam.   Review of systems form notable for N/A.   Updated history tabs and problem list reviewed.   OBJECTIVE:   BP (!) 124/59   Pulse 62   Ht '5\' 4"'  (1.626 m)   Wt 157 lb 9.6 oz (71.5 kg)   SpO2 99%   BMI 27.05 kg/m   Physical Exam Vitals and nursing note reviewed.  Constitutional:      Appearance: Normal appearance.  HENT:     Head: Normocephalic.     Right Ear: Tympanic membrane and ear canal normal.     Left Ear: Tympanic membrane and ear canal normal.     Mouth/Throat:     Mouth: Mucous membranes are moist.     Pharynx: No oropharyngeal exudate or posterior oropharyngeal erythema.  Eyes:     General:        Right eye: No discharge.        Left eye: No discharge.     Extraocular Movements: Extraocular movements intact.     Conjunctiva/sclera: Conjunctivae normal.     Pupils: Pupils are equal, round, and reactive to light.  Cardiovascular:     Rate and Rhythm: Normal rate and regular rhythm.     Heart sounds: Normal heart sounds. No murmur heard. Pulmonary:     Effort: Pulmonary effort is normal. No respiratory distress.     Breath sounds: Normal breath sounds. No wheezing.  Abdominal:     General: Bowel sounds are normal. There is no distension.     Palpations: Abdomen is soft. There is no mass.     Tenderness: There is no abdominal tenderness.  Musculoskeletal:        General: Normal range of motion.     Cervical back: Neck supple.  Neurological:     General: No focal deficit present.     Mental Status: She is oriented to person, place, and time.  Psychiatric:        Mood and Affect: Mood normal.        Behavior: Behavior normal.        Judgment: Judgment normal.      ASSESSMENT/PLAN:   No problem-specific Assessment & Plan notes found for this encounter.    Annual Examination  See AVS for age appropriate  recommendations.   PHQ score 5, reviewed and discussed. Blood pressure reviewed and at goal <140/90.  Asked about intimate partner violence and patient reports: None.  The patient currently uses None for contraception. Folate recommended as appropriate, minimum of 400 mcg per day.  Advanced directives : [name] - not official   Considered the following items based upon USPSTF recommendations:  HIV testing: discussed Hepatitis C: discussed Hepatitis B:  NA Syphilis if at high risk: discussed GC/CT not at high risk and not ordered. Last sexual activity > 1 yrs ago Lipid panel (nonfasting or fasting) discussed based upon AHA recommendations and ordered.  Consider repeat every 4-6 years.  Reviewed risk factors for latent tuberculosis and not indicated  Discussed family history, BRCA testing not indicated.   She smoked 1/2 PPD for many years and quit for 3 yrs. She now smokes about 1/2 a pack per day for about a year. Lung cancer screen discussed and CT lungs ordered Cervical cancer screening: prior Pap reviewed, repeat due in 2025 Immunizations She declined flu shot. Shingrix recommended. She can schedule pharmacy vaccination.  DM2: A1C improved. F/U in 3 months, for DM management.  Follow up in 1 year or sooner if indicated.    Andrena Mews, MD [place]

## 2022-08-15 LAB — LIPID PANEL
Chol/HDL Ratio: 2.3 ratio (ref 0.0–4.4)
Cholesterol, Total: 102 mg/dL (ref 100–199)
HDL: 44 mg/dL (ref >39)
LDL Chol Calc (NIH): 35 mg/dL (ref 0–99)
Triglycerides: 132 mg/dL (ref 0–149)
VLDL Cholesterol Cal: 23 mg/dL (ref 5–40)

## 2022-08-15 LAB — BASIC METABOLIC PANEL
BUN/Creatinine Ratio: 14 (ref 12–28)
BUN: 10 mg/dL (ref 8–27)
CO2: 22 mmol/L (ref 20–29)
Calcium: 9.4 mg/dL (ref 8.7–10.3)
Chloride: 99 mmol/L (ref 96–106)
Creatinine, Ser: 0.73 mg/dL (ref 0.57–1.00)
Glucose: 138 mg/dL — ABNORMAL HIGH (ref 70–99)
Potassium: 4.6 mmol/L (ref 3.5–5.2)
Sodium: 138 mmol/L (ref 134–144)
eGFR: 94 mL/min/{1.73_m2} (ref >59)

## 2022-08-15 LAB — MICROALBUMIN / CREATININE URINE RATIO
Creatinine, Urine: 65.8 mg/dL
Microalb/Creat Ratio: 9 mg/g creat (ref 0–29)
Microalbumin, Urine: 5.6 ug/mL

## 2022-08-16 ENCOUNTER — Encounter: Payer: Self-pay | Admitting: Family Medicine

## 2022-08-16 ENCOUNTER — Other Ambulatory Visit: Payer: Self-pay | Admitting: Family Medicine

## 2022-08-16 MED ORDER — ESCITALOPRAM OXALATE 20 MG PO TABS: ORAL_TABLET | ORAL | 0 refills | Status: DC

## 2022-08-24 ENCOUNTER — Ambulatory Visit
Admission: RE | Admit: 2022-08-24 | Discharge: 2022-08-24 | Disposition: A | Discharge status: Home / Self Care | Payer: No Typology Code available for payment source | Source: Ambulatory Visit | Attending: Family Medicine | Admitting: Family Medicine

## 2022-08-24 DIAGNOSIS — Z122 Encounter for screening for malignant neoplasm of respiratory organs: Secondary | ICD-10-CM

## 2022-08-28 ENCOUNTER — Encounter: Payer: Self-pay | Admitting: Family Medicine

## 2022-09-17 ENCOUNTER — Other Ambulatory Visit: Payer: No Typology Code available for payment source

## 2022-11-14 ENCOUNTER — Emergency Department (HOSPITAL_BASED_OUTPATIENT_CLINIC_OR_DEPARTMENT_OTHER)
Admission: EM | Admit: 2022-11-14 | Discharge: 2022-11-15 | Disposition: A | Discharge status: Home / Self Care | Payer: No Typology Code available for payment source | Attending: Emergency Medicine | Admitting: Emergency Medicine

## 2022-11-14 ENCOUNTER — Encounter (HOSPITAL_BASED_OUTPATIENT_CLINIC_OR_DEPARTMENT_OTHER): Payer: Self-pay | Admitting: Urology

## 2022-11-14 ENCOUNTER — Other Ambulatory Visit: Payer: Self-pay

## 2022-11-14 ENCOUNTER — Telehealth: Payer: Self-pay | Admitting: Student

## 2022-11-14 DIAGNOSIS — Z7984 Long term (current) use of oral hypoglycemic drugs: Secondary | ICD-10-CM | POA: Diagnosis not present

## 2022-11-14 DIAGNOSIS — R5383 Other fatigue: Secondary | ICD-10-CM | POA: Diagnosis present

## 2022-11-14 DIAGNOSIS — U071 COVID-19: Secondary | ICD-10-CM | POA: Diagnosis not present

## 2022-11-14 DIAGNOSIS — Z794 Long term (current) use of insulin: Secondary | ICD-10-CM | POA: Diagnosis not present

## 2022-11-14 DIAGNOSIS — E1165 Type 2 diabetes mellitus with hyperglycemia: Secondary | ICD-10-CM | POA: Diagnosis not present

## 2022-11-14 LAB — COMPREHENSIVE METABOLIC PANEL
ALT: 36 U/L (ref 0–44)
AST: 40 U/L (ref 15–41)
Albumin: 4 g/dL (ref 3.5–5.0)
Alkaline Phosphatase: 58 U/L (ref 38–126)
Anion gap: 12 (ref 5–15)
BUN: 28 mg/dL — ABNORMAL HIGH (ref 8–23)
CO2: 23 mmol/L (ref 22–32)
Calcium: 9 mg/dL (ref 8.9–10.3)
Chloride: 98 mmol/L (ref 98–111)
Creatinine, Ser: 1.04 mg/dL — ABNORMAL HIGH (ref 0.44–1.00)
GFR, Estimated: >60 mL/min (ref >60)
Glucose, Bld: 332 mg/dL — ABNORMAL HIGH (ref 70–99)
Potassium: 4 mmol/L (ref 3.5–5.1)
Sodium: 133 mmol/L — ABNORMAL LOW (ref 135–145)
Total Bilirubin: 0.4 mg/dL (ref 0.3–1.2)
Total Protein: 7.1 g/dL (ref 6.5–8.1)

## 2022-11-14 LAB — CBC WITH DIFFERENTIAL/PLATELET
Abs Immature Granulocytes: 0 10*3/uL (ref 0.00–0.07)
Basophils Absolute: 0 10*3/uL (ref 0.0–0.1)
Basophils Relative: 0 %
Eosinophils Absolute: 0.1 10*3/uL (ref 0.0–0.5)
Eosinophils Relative: 1 %
HCT: 33.4 % — ABNORMAL LOW (ref 36.0–46.0)
Hemoglobin: 11.9 g/dL — ABNORMAL LOW (ref 12.0–15.0)
Immature Granulocytes: 0 %
Lymphocytes Relative: 43 %
Lymphs Abs: 2.2 10*3/uL (ref 0.7–4.0)
MCH: 29.3 pg (ref 26.0–34.0)
MCHC: 35.6 g/dL (ref 30.0–36.0)
MCV: 82.3 fL (ref 80.0–100.0)
Monocytes Absolute: 0.3 10*3/uL (ref 0.1–1.0)
Monocytes Relative: 6 %
Neutro Abs: 2.5 10*3/uL (ref 1.7–7.7)
Neutrophils Relative %: 50 %
Platelets: 142 10*3/uL — ABNORMAL LOW (ref 150–400)
RBC: 4.06 MIL/uL (ref 3.87–5.11)
RDW: 11.2 % — ABNORMAL LOW (ref 11.5–15.5)
WBC: 5 10*3/uL (ref 4.0–10.5)
nRBC: 0 % (ref 0.0–0.2)

## 2022-11-14 LAB — URINALYSIS, ROUTINE W REFLEX MICROSCOPIC
Bilirubin Urine: NEGATIVE
Glucose, UA: >=500 mg/dL — AB
Hgb urine dipstick: NEGATIVE
Ketones, ur: NEGATIVE mg/dL
Nitrite: NEGATIVE
Protein, ur: NEGATIVE mg/dL
Specific Gravity, Urine: 1.015 (ref 1.005–1.030)
pH: 5 (ref 5.0–8.0)

## 2022-11-14 LAB — URINALYSIS, MICROSCOPIC (REFLEX)

## 2022-11-14 LAB — CBG MONITORING, ED: Glucose-Capillary: 321 mg/dL — ABNORMAL HIGH (ref 70–99)

## 2022-11-14 MED ORDER — SODIUM CHLORIDE 0.9 % IV BOLUS
2000.0000 mL | Freq: Once | INTRAVENOUS | Status: AC
  Administered 2022-11-14: 2000 mL via INTRAVENOUS

## 2022-11-14 MED ORDER — INSULIN ASPART 100 UNIT/ML IJ SOLN
5.0000 [IU] | Freq: Once | INTRAMUSCULAR | Status: AC
  Administered 2022-11-14: 5 [IU] via SUBCUTANEOUS

## 2022-11-14 NOTE — ED Triage Notes (Signed)
Pt states tested + for Covid,  States still feels fatigued, loss of appetite, dry mouth  States blood sugar elevated (277 this am)

## 2022-11-14 NOTE — ED Provider Notes (Signed)
[facility] EMERGENCY DEPARTMENT Provider Note   CSN: [id number] Arrival date & time: 11/14/22  1753     History  Chief Complaint  Patient presents with   Fatigue   Covid Positive    Danielle Mccann is a 62 y.o. female with a history of type 2 diabetes on metformin presented ED with generalized fatigue.  She began having viral symptoms about 4 days ago on Sunday and tested positive for COVID that day.  She reports bad taste in her mouth, loss of energy, headache, poor appetite.  She is concerned because her blood sugars were running high at home around 200, normally she is 100-150.  She is not on insulin.  HPI     Home Medications Prior to Admission medications   Medication Sig Start Date End Date Taking? Authorizing Provider  atorvastatin (LIPITOR) 10 MG tablet TAKE 1 TABLET(10 MG) BY MOUTH 2 TIMES A WEEK 09/18/21   Kinnie Feil, MD  escitalopram (LEXAPRO) 20 MG tablet Start taking 1/2 tablet of Lexapro 20 mg ( I.e 10 mg) daily for 1 weeks. Then take 1/2 tablet (10 mg) every other day for 1 weeks. Then take 1/2 tablet (10 mg) every three days for 1 week Then take 1/2 tablet (10 mg) every 5 days x 3 doses and stop 08/16/22 10/04/22  Andrena Mews T, MD  glucose blood (FREESTYLE INSULINX TEST) test strip Use to check glucose once daily 03/20/22   Andrena Mews T, MD  glucose monitoring kit (FREESTYLE) monitoring kit Use to check glucose once daily 07/20/16   Andrena Mews T, MD  Lancets (FREESTYLE) lancets Use to check glucose once daily 03/20/22   Andrena Mews T, MD  metFORMIN (GLUCOPHAGE-XR) 500 MG 24 hr tablet TAKE 2 TABLETS(1000 MG) BY MOUTH DAILY WITH BREAKFAST 07/05/22   Kinnie Feil, MD  naproxen (NAPROSYN) 500 MG tablet Take 1 tablet (500 mg total) by mouth 2 (two) times daily. Patient not taking: Reported on 08/14/2022 06/26/22   Raspet, Derry Skill, PA-C  Omega-3 Fatty Acids (FISH OIL) 1000 MG CAPS Take 1 capsule (1,000 mg total) by mouth 2 (two)  times daily. 04/05/18   Kinnie Feil, MD      Allergies    Patient has no known allergies.    Review of Systems   Review of Systems  Physical Exam Updated Vital Signs BP 118/67   Pulse 76   Temp 98.1 F (36.7 C) (Oral)   Resp 15   Ht '5\' 4"'$  (1.626 m)   Wt 71.5 kg   SpO2 98%   BMI 27.06 kg/m  Physical Exam Constitutional:      General: She is not in acute distress. HENT:     Head: Normocephalic and atraumatic.  Eyes:     Conjunctiva/sclera: Conjunctivae normal.     Pupils: Pupils are equal, round, and reactive to light.  Cardiovascular:     Rate and Rhythm: Normal rate and regular rhythm.  Pulmonary:     Effort: Pulmonary effort is normal. No respiratory distress.  Abdominal:     General: There is no distension.     Tenderness: There is no abdominal tenderness.  Skin:    General: Skin is warm and dry.  Neurological:     General: No focal deficit present.     Mental Status: She is alert and oriented to person, place, and time. Mental status is at baseline.  Psychiatric:        Mood and Affect: Mood normal.  Behavior: Behavior normal.     ED Results / Procedures / Treatments   Labs (all labs ordered are listed, but only abnormal results are displayed) Labs Reviewed  CBC WITH DIFFERENTIAL/PLATELET - Abnormal; Notable for the following components:      Result Value   Hemoglobin 11.9 (*)    HCT 33.4 (*)    RDW 11.2 (*)    Platelets 142 (*)    All other components within normal limits  COMPREHENSIVE METABOLIC PANEL - Abnormal; Notable for the following components:   Sodium 133 (*)    Glucose, Bld 332 (*)    BUN 28 (*)    Creatinine, Ser 1.04 (*)    All other components within normal limits  URINALYSIS, ROUTINE W REFLEX MICROSCOPIC - Abnormal; Notable for the following components:   APPearance HAZY (*)    Glucose, UA >=500 (*)    Leukocytes,Ua TRACE (*)    All other components within normal limits  URINALYSIS, MICROSCOPIC (REFLEX) - Abnormal;  Notable for the following components:   Bacteria, UA FEW (*)    All other components within normal limits  CBG MONITORING, ED - Abnormal; Notable for the following components:   Glucose-Capillary 321 (*)    All other components within normal limits    EKG None  Radiology No results found.  Procedures Procedures    Medications Ordered in ED Medications  sodium chloride 0.9 % bolus 2,000 mL (has no administration in time range)  insulin aspart (novoLOG) injection 5 Units (5 Units Subcutaneous Given 11/14/22 2304)    ED Course/ Medical Decision Making/ A&P Clinical Course as of 11/14/22 2306  Wed Nov 14, 2022  2303 Pt signed out to Dr Tyrone Nine [MT]    Clinical Course User Index [MT] Langston Masker Carola Rhine, MD                           Medical Decision Making Amount and/or Complexity of Data Reviewed Labs: ordered.  Risk Prescription drug management.   This patient presents to the ED with concern for underlying fatigue, poor appetite,. This involves an extensive number of treatment options, and is a complaint that carries with it a high risk of complications and morbidity.  The differential diagnosis includes diabetic ketosis versus viral syndrome versus dehydration versus other  Co-morbidities that complicate the patient evaluation: History of diabetes at high risk of complication, hyperglycemia   I ordered and personally interpreted labs.  The pertinent results include: Elevation of BUN and creatinine consistent with poor oral hydration.  Glucose is 332.  No anion gap.  No ketones in the urine.  White blood cell count within normal limits.  The patient was maintained on a cardiac monitor.  I personally viewed and interpreted the cardiac monitored which showed an underlying rhythm of: NSR  I ordered medication including 2 L of IV fluid and IV insulin for hydration and hyperglycemia  I have reviewed the patients home medicines and have made adjustments as needed  Test  Considered: Low suspicion for meningitis, pneumonia, PE.  Do not feel that patient has any emergent CT imaging at this time.  Doubt ACS.   Dispostion:  Signed out to overnight EDP Dr Tyrone Nine pending repeat POC glucose after fluids and insulin.   I anticipate the patient likely be able to be discharged home.  We discussed antiviral therapy but she is now approaching day 5 of symptoms, and I think she would benefit little from these antiviral medications,  and the side effects may be more concerning.  We discussed risks and benefits of these medications and she states she does not want to start antiviral pills.  I do not see evidence of DKA, sepsis at this time.         Final Clinical Impression(s) / ED Diagnoses Final diagnoses:  None    Rx / DC Orders ED Discharge Orders     None         Cecylia Brazill, Carola Rhine, MD 11/14/22 2306

## 2022-11-14 NOTE — ED Notes (Signed)
Pt states she was dx with covid on Sunday.  Reports extreme fatigue and no appetite

## 2022-11-14 NOTE — Telephone Encounter (Signed)
After hours line:  Pt called d/t blood sugar in high 200's-low 300's. She tested positive for COVID on Sunday and feels terrible, low energy, low appetite, dry mouth, frequent urination. No SOB, no fever.   When I called pt she was already at [facility] to be been.  I advised pt to let us know if she feels she needs to be seen in our office after leaving [facility].

## 2022-11-15 ENCOUNTER — Ambulatory Visit: Payer: No Typology Code available for payment source | Admitting: Family Medicine

## 2022-11-15 ENCOUNTER — Telehealth: Payer: Self-pay

## 2022-11-15 ENCOUNTER — Encounter: Payer: Self-pay | Admitting: Family Medicine

## 2022-11-15 VITALS — BP 130/58 | HR 72

## 2022-11-15 DIAGNOSIS — N760 Acute vaginitis: Secondary | ICD-10-CM

## 2022-11-15 DIAGNOSIS — E119 Type 2 diabetes mellitus without complications: Secondary | ICD-10-CM | POA: Diagnosis not present

## 2022-11-15 DIAGNOSIS — U071 COVID-19: Secondary | ICD-10-CM | POA: Diagnosis not present

## 2022-11-15 LAB — CBG MONITORING, ED: Glucose-Capillary: 186 mg/dL — ABNORMAL HIGH (ref 70–99)

## 2022-11-15 LAB — POCT GLYCOSYLATED HEMOGLOBIN (HGB A1C): HbA1c, POC (controlled diabetic range): 8.9 % — AB (ref 0.0–7.0)

## 2022-11-15 LAB — GLUCOSE, POCT (MANUAL RESULT ENTRY): POC Glucose: 342 mg/dl — AB (ref 70–99)

## 2022-11-15 MED ORDER — FLUCONAZOLE 150 MG PO TABS: 150.0000 mg | ORAL_TABLET | Freq: Once | ORAL | 0 refills | Status: AC

## 2022-11-15 MED ORDER — EMPAGLIFLOZIN 10 MG PO TABS: 10.0000 mg | ORAL_TABLET | Freq: Every day | ORAL | 1 refills | Status: DC

## 2022-11-15 NOTE — Assessment & Plan Note (Signed)
Gradually recovering. She is day 5 of her symptoms. Paxlovid discussed, but she declined. Monitor closely for improvement.

## 2022-11-15 NOTE — ED Provider Notes (Signed)
Received patient in turnover from Dr. Langston Masker.  Please see their note for further details of Hx, PE.  Briefly patient is a 62 y.o. female with a Fatigue and Covid Positive .  Patient with mild hyperglycemia here, plan for iv fluids, iv insulin and reassess.  Patient with improvement here.  Will discharge home.  PCP follow-up.   1:09 AM:  I have discussed the diagnosis/risks/treatment options with the patient.  Evaluation and diagnostic testing in the emergency department does not suggest an emergent condition requiring admission or immediate intervention beyond what has been performed at this time.  They will follow up with PCP. We also discussed returning to the ED immediately if new or worsening sx occur. We discussed the sx which are most concerning (e.g., sudden worsening pain, fever, inability to tolerate by mouth) that necessitate immediate return. Medications administered to the patient during their visit and any new prescriptions provided to the patient are listed below.  Medications given during this visit Medications  sodium chloride 0.9 % bolus 2,000 mL (2,000 mLs Intravenous New Bag/Given 11/14/22 2305)  insulin aspart (novoLOG) injection 5 Units (5 Units Subcutaneous Given 11/14/22 2304)     The patient appears reasonably screen and/or stabilized for discharge and I doubt any other medical condition or other [facility] requiring further screening, evaluation, or treatment in the ED at this time prior to discharge.     Deno Etienne, DO 11/15/22 0110

## 2022-11-15 NOTE — Patient Instructions (Signed)
Empagliflozin Tablets What is this medication? EMPAGLIFLOZIN (EM pa gli FLOE zin) treats type 2 diabetes. It works by helping your kidneys remove sugar (glucose) from your blood through the urine, which decreases your blood sugar. It may also be used to lower the risk of worsening heart failure. It can also lower the risk of death caused by heart failure and type 2 diabetes. It works by helping your kidneys remove salt (sodium) from your blood through the urine. This decreases the amount of work the heart has to do. Changes to diet and exercise are often combined with this medication. This medicine may be used for other purposes; ask your health care provider or pharmacist if you have questions. COMMON BRAND NAME(S): Jardiance What should I tell my care team before I take this medication? They need to know if you have any of these conditions: Dehydration Diabetic ketoacidosis Diet low in salt Eating less due to illness, surgery, dieting, or any other reason Frequently drink alcohol Having surgery High cholesterol High levels of potassium in the blood History of pancreatitis or pancreas problems History of yeast infection of the penis or vagina Infections in the bladder, kidneys, or urinary tract Kidney disease Liver disease Low blood pressure On hemodialysis Problems urinating Type 1 diabetes Uncircumcised female An unusual or allergic reaction to empagliflozin, other medications, foods, dyes, or preservatives Pregnant or trying to get pregnant Breast-feeding How should I use this medication? Take this medication by mouth with water. Take it as directed on the prescription label at the same time every day. You may take it with or without food. Keep taking it unless your care team tells you to stop. A special MedGuide will be given to you by the pharmacist with each prescription and refill. Be sure to read this information carefully each time. Talk to your care team about the use of this  medication in children. While it may be prescribed for children as young as 10 years for selected conditions, precautions do apply. Overdosage: If you think you have taken too much of this medicine contact a poison control center or emergency room at once. NOTE: This medicine is only for you. Do not share this medicine with others. What if I miss a dose? If you miss a dose, take it as soon as you can. If it is almost time for your next dose, take only that dose. Do not take double or extra doses. What may interact with this medication? Alcohol Diuretics Insulin Lithium This list may not describe all possible interactions. Give your health care provider a list of all the medicines, herbs, non-prescription drugs, or dietary supplements you use. Also tell them if you smoke, drink alcohol, or use illegal drugs. Some items may interact with your medicine. What should I watch for while using this medication? Visit your care team for regular checks on your progress. Tell your care team if your symptoms do not start to get better or if they get worse. This medication can cause a serious condition in which there is too much acid in the blood. If you develop nausea, vomiting, stomach pain, unusual tiredness, or breathing problems, stop taking this medication and call your care team right away. If possible, use a ketone dipstick to check for ketones in your urine. Check with your care team if you have severe diarrhea, nausea, and vomiting, or if you sweat a lot. The loss of too much body fluid may make it dangerous for you to take this medication. A test   called the HbA1C (A1C) will be monitored. This is a simple blood test. It measures your blood sugar control over the last 2 to 3 months. You will receive this test every 3 to 6 months. Learn how to check your blood sugar. Learn the symptoms of low and high blood sugar and how to manage them. Always carry a quick-source of sugar with you in case you have  symptoms of low blood sugar. Examples include hard sugar candy or glucose tablets. Make sure others know that you can choke if you eat or drink when you develop serious symptoms of low blood sugar, such as seizures or unconsciousness. Get medical help at once. Tell your care team if you have high blood sugar. You might need to change the dose of your medication. If you are sick or exercising more than usual, you may need to change the dose of your medication. What side effects may I notice from receiving this medication? Side effects that you should report to your care team as soon as possible: Allergic reactions--skin rash, itching, hives, swelling of the face, lips, tongue, or throat Dehydration--increased thirst, dry mouth, feeling faint or lightheaded, headache, dark yellow or brown urine Diabetic ketoacidosis (DKA)--increased thirst or amount of urine, dry mouth, fatigue, fruity odor to breath, trouble breathing, stomach pain, nausea, vomiting Genital yeast infection--redness, swelling, pain, or itchiness, odor, thick or lumpy discharge New pain or tenderness, change in skin color, sores or ulcers, infection of the leg or foot Infection or redness, swelling, tenderness, or pain in the genitals, or area from the genitals to the back of the rectum Urinary tract infection (UTI)--burning when passing urine, passing frequent small amounts of urine, bloody or cloudy urine, pain in the lower back or sides This list may not describe all possible side effects. Call your doctor for medical advice about side effects. You may report side effects to FDA at 1-800-FDA-1088. Where should I keep my medication? Keep out of the reach of children and pets. Store at room temperature between 20 and 25 degrees C (68 and 77 degrees F). Get rid of any unused medication after the expiration date. To get rid of medications that are no longer needed or have expired: Take the medication to a medication take-back program.  Check with your pharmacy or law enforcement to find a location. If you cannot return the medication, check the label or package insert to see if the medication should be thrown out in the garbage or flushed down the toilet. If you are not sure, ask your care team. If it is safe to put it in the trash, take the medication out of the container. Mix the medication with cat litter, dirt, coffee grounds, or other unwanted substance. Seal the mixture in a bag or container. Put it in the trash. NOTE: This sheet is a summary. It may not cover all possible information. If you have questions about this medicine, talk to your doctor, pharmacist, or health care provider.  2023 Elsevier/Gold Standard (2021-01-05 00:00:00)  

## 2022-11-15 NOTE — ED Notes (Signed)
Pt A&OX4 ambulatory at d/c with independent steady gait. Pt verbalized understanding of d/c instructions and follow up care. 

## 2022-11-15 NOTE — Discharge Instructions (Signed)
Please let your family doctor know about your elevated blood sugar.  This is not uncommon with an infection.  They may need to recheck you after your illness is improved.  Take tylenol 2 pills 4 times a day and motrin 4 pills 3 times a day.  Drink plenty of fluids.  Return for worsening shortness of breath, headache, confusion. Follow up with your family doctor.

## 2022-11-15 NOTE — Progress Notes (Signed)
    SUBJECTIVE:   CHIEF COMPLAINT / HPI:   DM2/Hyperglycemia: She c/o polyuria and polydipsia for a few days. She felt this was related to her COVID-19 infection. Her home CBG has been > 200 in the last few days. She denies any GI symptoms, but her appetite just picked up today.She was evaluated at the ED for this yesterday. No other concerns.  Vulva itch: Concern for recurrent yeast infection. This is typical of her presentation. She is frustrated about this.   COVID-19: She started having URI symptoms on 11/11/22 and got tested positive for COVID same day. She denied ever having a fever. Her symptoms has improved a lot.   PERTINENT  PMH / PSH: PMHx reviewed  OBJECTIVE:   BP (!) 130/58   Pulse 72   SpO2 100%   Physical Exam Vitals and nursing note reviewed.  Cardiovascular:     Rate and Rhythm: Normal rate and regular rhythm.     Heart sounds: Normal heart sounds. No murmur heard. Pulmonary:     Effort: Pulmonary effort is normal. No respiratory distress.     Breath sounds: Normal breath sounds. No wheezing or rhonchi.  Abdominal:     General: Bowel sounds are normal.     Palpations: Abdomen is soft. There is no mass.     Tenderness: There is no abdominal tenderness.  Musculoskeletal:     Right lower leg: No edema.     Left lower leg: No edema.     Comments: Sensory exam of the foot is normal, tested with the monofilament. Good pulses, no lesions or ulcers, good peripheral pulses.       ASSESSMENT/PLAN:   DM (diabetes mellitus), type 2 ([place]) I reviewed her lab results and visit note from her recent ED visit. No concern for DKA. She is well appearing today. POC Gluc done today was 300+ and her A1C was 8.9 which is a increase from 6. She is compliant with Metformin 1000 mg QD. Continue same. I discussed addition of GLP1 vs SGLT2 to her regimen. She does not want to lose weight, hence we opted for Empagliflozin 10 mg QD. Good hydration encouraged. Continue home CBG  monitoring. DKA red flag symptoms and indications for ED discussed. Consider insulin in the future. F/U in 2 weeks for reassessment.   Vaginitis Likely candida given her hyperglycemia and similar presentation. Empirical treatment with diflucan recommended. F/U soon for pelvic exam if no improvement. She agreed with the plan.   COVID-19 virus infection Gradually recovering. She is day 5 of her symptoms. Paxlovid discussed, but she declined. Monitor closely for improvement.    Shingrix at the pharmacy discussed when feeling better.  Andrena Mews, MD [place]

## 2022-11-15 NOTE — Telephone Encounter (Signed)
Patient calls nurse line regarding continued elevated blood sugar readings this AM. Patient was seen in the ED yesterday for COVID and elevated blood sugar levels. She reports fasting CBG of 221 this morning.   Patient does not inject insulin. Patient states that she "feels alright" for right now. Patient reports that she is feeling much better after receiving fluids and insulin in the ED last night.   Advised that patient should schedule follow up appointment.   Provided with supportive measures and ED precautions. Scheduled with Dr. Adah Salvage tomorrow morning.   Talbot Grumbling, RN

## 2022-11-15 NOTE — Assessment & Plan Note (Signed)
Likely candida given her hyperglycemia and similar presentation. Empirical treatment with diflucan recommended. F/U soon for pelvic exam if no improvement. She agreed with the plan.

## 2022-11-15 NOTE — Assessment & Plan Note (Addendum)
I reviewed her lab results and visit note from her recent ED visit. No concern for DKA. She is well appearing today. POC Gluc done today was 300+ and her A1C was 8.9 which is a increase from 6. She is compliant with Metformin 1000 mg QD. Continue same. I discussed addition of GLP1 vs SGLT2 to her regimen. She does not want to lose weight, hence we opted for Empagliflozin 10 mg QD. Good hydration encouraged. Continue home CBG monitoring. DKA red flag symptoms and indications for ED discussed. Consider insulin in the future. F/U in 2 weeks for reassessment.

## 2022-11-16 ENCOUNTER — Ambulatory Visit: Payer: No Typology Code available for payment source | Admitting: Student

## 2022-11-28 ENCOUNTER — Other Ambulatory Visit: Payer: Self-pay | Admitting: Family Medicine

## 2022-11-30 ENCOUNTER — Telehealth (INDEPENDENT_AMBULATORY_CARE_PROVIDER_SITE_OTHER): Payer: No Typology Code available for payment source | Admitting: Family Medicine

## 2022-11-30 ENCOUNTER — Encounter: Payer: Self-pay | Admitting: Family Medicine

## 2022-11-30 DIAGNOSIS — E119 Type 2 diabetes mellitus without complications: Secondary | ICD-10-CM | POA: Diagnosis not present

## 2022-11-30 DIAGNOSIS — Z7984 Long term (current) use of oral hypoglycemic drugs: Secondary | ICD-10-CM

## 2022-11-30 DIAGNOSIS — Z1211 Encounter for screening for malignant neoplasm of colon: Secondary | ICD-10-CM

## 2022-11-30 NOTE — Patient Instructions (Addendum)
  It was nice seeing you today. I am glad your CBG has improved. Continue current dose of Jardiance 10 mg daily and Metformin 1000 mg daily. Keep glucose level between 130 -180.  Return soon if numbers are running out of range. Otherwise, I will see you in 3-4 months.

## 2022-11-30 NOTE — Progress Notes (Signed)
   [facility] Telemedicine Visit  Patient consented to have virtual visit and was identified by name and date of birth. Method of visit: Video  Encounter participants: Patient: Danielle Mccann - located at Home Provider: Andrena Mews - located at [facility] Office Others (if applicable): N/A  Chief Complaint: DM F/U  HPI:  DM2: She complies with Metformin XL 1000 mg (2 tabs of 500 mg) daily and Jardiance 10 mg QD. Her CBG improved from 300+ to 135-179. Average readings are in the 150s and the lowest of 91. No hypoglycemic episodes. She wishes her number could be mostly in the 100s.  HM: She wonders if she is due for a colonoscopy. No GI symptoms.  ROS: per HPI  Pertinent PMHx: PMHx reviewed  Physical Exam Constitutional:      Appearance: She is not ill-appearing.  Pulmonary:     Effort: Pulmonary effort is normal. No respiratory distress.  Neurological:     Mental Status: She is alert and oriented to person, place, and time.  Psychiatric:        Mood and Affect: Mood normal.        Thought Content: Thought content normal.     Assessment/Plan:  DM (diabetes mellitus), type 2 ([place]) Her home CBG improved a lot. As discussed with her, her home CBG can range from 130 - 180 for now. Continue current regimen. F/U soon if having lots of highs or lows. Otherwise, return for A1C in 3-4 months. She agreed with the plan.   Colon cancer screen: Record review. Colonoscopy last done in 07/2013 She is due this year. Plan to go ahead and refer to Dr. Fuller Plan to get her set up for her routine screening. She agreed with the plan. Referral placed.    Time spent during visit with patient: 10 minutes Andrena Mews, MD [place]

## 2022-11-30 NOTE — Assessment & Plan Note (Signed)
Her home CBG improved a lot. As discussed with her, her home CBG can range from 130 - 180 for now. Continue current regimen. F/U soon if having lots of highs or lows. Otherwise, return for A1C in 3-4 months. She agreed with the plan.

## 2023-02-05 ENCOUNTER — Other Ambulatory Visit: Payer: Self-pay | Admitting: Family Medicine

## 2023-03-26 ENCOUNTER — Encounter: Payer: Self-pay | Admitting: Family Medicine

## 2023-03-26 LAB — HM DIABETES EYE EXAM

## 2023-04-16 ENCOUNTER — Ambulatory Visit: Payer: No Typology Code available for payment source | Admitting: Family Medicine

## 2023-04-23 ENCOUNTER — Encounter: Payer: Self-pay | Admitting: Family Medicine

## 2023-04-23 ENCOUNTER — Ambulatory Visit (INDEPENDENT_AMBULATORY_CARE_PROVIDER_SITE_OTHER): Payer: No Typology Code available for payment source | Admitting: Family Medicine

## 2023-04-23 VITALS — BP 115/73 | HR 72 | Ht 64.0 in | Wt 157.6 lb

## 2023-04-23 DIAGNOSIS — E781 Pure hyperglyceridemia: Secondary | ICD-10-CM

## 2023-04-23 DIAGNOSIS — E119 Type 2 diabetes mellitus without complications: Secondary | ICD-10-CM | POA: Diagnosis not present

## 2023-04-23 LAB — POCT GLYCOSYLATED HEMOGLOBIN (HGB A1C): HbA1c, POC (controlled diabetic range): 6.5 % (ref 0.0–7.0)

## 2023-04-23 NOTE — Progress Notes (Signed)
    SUBJECTIVE:   CHIEF COMPLAINT / HPI:   DM2:  Here for f/u. She is compliant with her Jardiance and Metformin. Her Jardiance costs her $100 for a three-month supply, and she wonders if there is a way to reduce her cost.  HLD: Compliant with her Lipitor  one tab twice a week. No new concerns.    PERTINENT  PMH / PSH: PMHx reviewed.  OBJECTIVE:   BP 115/73   Pulse 72   Ht 5\' 4"  (1.626 m)   Wt 157 lb 9.6 oz (71.5 kg)   SpO2 100%   BMI 27.05 kg/m   Physical Exam Vitals and nursing note reviewed.  Cardiovascular:     Rate and Rhythm: Normal rate and regular rhythm.     Heart sounds: Normal heart sounds. No murmur heard. Pulmonary:     Effort: Pulmonary effort is normal. No respiratory distress.     Breath sounds: Normal breath sounds. No wheezing.  Abdominal:     General: Bowel sounds are normal. There is no distension.     Palpations: Abdomen is soft. There is no mass.  Musculoskeletal:     Right lower leg: No edema.     Left lower leg: No edema.      ASSESSMENT/PLAN:   DM (diabetes mellitus), type 2 ([facility]) A1C improved today from 8.9 to 6.5 Continue Metformin 1000 mg every day and Jardiance 10 mg every day. I will message pharmacy tech regarding medication coverage and follow up with the patient. F/U in 3 months for A1C check.  Hypertriglyceridemia Continue Lipitor 10 mg twice weekly. F/U in 3 months for FLP.     Janit Pagan, MD [facility]

## 2023-04-23 NOTE — Patient Instructions (Signed)
It was nice seeing you today.  Your colon cancer screening is coming up. Please call your gastroenterologist office to schedule your colon cancer screening at:  [facility] Address: [address], [place], [place] [address] Phone: [phone]

## 2023-04-23 NOTE — Assessment & Plan Note (Signed)
Continue Lipitor 10 mg twice weekly. F/U in 3 months for FLP.

## 2023-04-23 NOTE — Assessment & Plan Note (Signed)
A1C improved today from 8.9 to 6.5 Continue Metformin 1000 mg every day and Jardiance 10 mg every day. I will message pharmacy tech regarding medication coverage and follow up with the patient. F/U in 3 months for A1C check.

## 2023-04-24 ENCOUNTER — Other Ambulatory Visit (HOSPITAL_COMMUNITY): Payer: Self-pay

## 2023-04-26 ENCOUNTER — Telehealth: Payer: Self-pay | Admitting: Family Medicine

## 2023-04-26 MED ORDER — DAPAGLIFLOZIN PROPANEDIOL 5 MG PO TABS: 5.0000 mg | ORAL_TABLET | Freq: Every day | ORAL | 3 refills | Status: DC

## 2023-04-26 NOTE — Addendum Note (Signed)
Addended by: Janit Pagan T on: 04/26/2023 09:53 AM   Modules accepted: Orders

## 2023-04-26 NOTE — Telephone Encounter (Signed)
-----   Message from Otho Najjar, CPhT sent at 04/25/2023  4:21 PM EDT ----- Of course. I can attempt enrollment online, if unsuccessful, I will mail the application to her home :) ----- Message ----- From: Doreene Eland, MD Sent: 04/24/2023   4:07 PM EDT To: Otho Najjar, CPhT  Would you help with the [place] if I [place] her to [place] then?  ----- Message ----- From: Otho Najjar, CPhT Sent: 04/24/2023   3:47 PM EDT To: Doreene Eland, MD  Hey Dr. Lum Babe!  Looks like patient is currently uninsured and should qualify for assistance with [place] for [place]. I would also encourage her to apply for medicaid as well! ----- Message ----- From: Doreene Eland, MD Sent: 04/23/2023   9:15 AM EDT To: Otho Najjar, CPhT  Hello Camille,  Could you help with patient medication option? Her Jardiance cost $100 per 3 months. Would Marcelline Deist be cheaper or could she have medication assistance set up?  Thanks  [employer]

## 2023-04-26 NOTE — Telephone Encounter (Signed)
HIPAA compliance callback message left.   Please advise her when she calls that we can try to switch her Jardiance to [place] and apply for coverage. Please let me know if she want to proceed with the switch.

## 2023-04-26 NOTE — Telephone Encounter (Signed)
Patient returns call to nurse line.   Discussed switching Jardiance to [place].   Patient is amenable to this.   Will forward to PCP.

## 2023-04-29 NOTE — Telephone Encounter (Signed)
Online enrollment unsuccessful, will mail application to patients home and request proof of income.   I will get rx page of application to you asap for signature.

## 2023-04-30 ENCOUNTER — Telehealth: Payer: Self-pay

## 2023-04-30 NOTE — Telephone Encounter (Signed)
Mailed [place] application to patients home for assistance with Farxiga medication.

## 2023-04-30 NOTE — Telephone Encounter (Signed)
-----   Message from Doreene Eland, MD sent at 04/24/2023  4:07 PM EDT ----- Would you help with the [place] enrollment if I switch her to Marcelline Deist then?  ----- Message ----- From: Otho Najjar, CPhT Sent: 04/24/2023   3:47 PM EDT To: Doreene Eland, MD  Hey Dr. Lum Babe!  Looks like patient is currently uninsured and should qualify for assistance with [place] for [place]. I would also encourage her to apply for medicaid as well! ----- Message ----- From: Doreene Eland, MD Sent: 04/23/2023   9:15 AM EDT To: Otho Najjar, CPhT  Hello Logun Colavito,  Could you help with patient medication option? Her Jardiance cost $100 per 3 months. Would Marcelline Deist be cheaper or could she have medication assistance set up?  Thanks  [employer]

## 2023-05-28 ENCOUNTER — Other Ambulatory Visit: Payer: Self-pay | Admitting: Family Medicine

## 2023-05-28 DIAGNOSIS — Z1231 Encounter for screening mammogram for malignant neoplasm of breast: Secondary | ICD-10-CM

## 2023-05-29 NOTE — Telephone Encounter (Signed)
Medication Assistant form completed for Farxiga and placed in Danielle Mccann's physical box

## 2023-06-04 ENCOUNTER — Other Ambulatory Visit (HOSPITAL_COMMUNITY): Payer: Self-pay

## 2023-06-24 ENCOUNTER — Encounter: Payer: Self-pay | Admitting: Gastroenterology

## 2023-06-28 ENCOUNTER — Ambulatory Visit
Admission: RE | Admit: 2023-06-28 | Discharge: 2023-06-28 | Disposition: A | Discharge status: Home / Self Care | Payer: No Typology Code available for payment source | Source: Ambulatory Visit | Attending: Family Medicine | Admitting: Family Medicine

## 2023-06-28 DIAGNOSIS — Z1231 Encounter for screening mammogram for malignant neoplasm of breast: Secondary | ICD-10-CM

## 2023-07-17 ENCOUNTER — Ambulatory Visit (AMBULATORY_SURGERY_CENTER): Payer: No Typology Code available for payment source

## 2023-07-17 VITALS — Ht 64.0 in | Wt 161.0 lb

## 2023-07-17 DIAGNOSIS — Z1211 Encounter for screening for malignant neoplasm of colon: Secondary | ICD-10-CM

## 2023-07-17 MED ORDER — NA SULFATE-K SULFATE-MG SULF 17.5-3.13-1.6 GM/177ML PO SOLN: 1.0000 | Freq: Once | ORAL | 0 refills | Status: AC

## 2023-07-17 NOTE — Progress Notes (Signed)

## 2023-07-31 ENCOUNTER — Encounter: Payer: Self-pay | Admitting: Gastroenterology

## 2023-08-02 ENCOUNTER — Encounter: Payer: Self-pay | Admitting: Family Medicine

## 2023-08-02 ENCOUNTER — Ambulatory Visit (INDEPENDENT_AMBULATORY_CARE_PROVIDER_SITE_OTHER): Payer: No Typology Code available for payment source | Admitting: Family Medicine

## 2023-08-02 VITALS — BP 132/64 | HR 71 | Ht 64.0 in | Wt 158.0 lb

## 2023-08-02 DIAGNOSIS — E119 Type 2 diabetes mellitus without complications: Secondary | ICD-10-CM | POA: Diagnosis not present

## 2023-08-02 LAB — POCT GLYCOSYLATED HEMOGLOBIN (HGB A1C): HbA1c, POC (controlled diabetic range): 8.8 % — AB (ref 0.0–7.0)

## 2023-08-02 MED ORDER — EMPAGLIFLOZIN 10 MG PO TABS: 10.0000 mg | ORAL_TABLET | Freq: Every day | ORAL | 1 refills | Status: DC

## 2023-08-02 NOTE — Patient Instructions (Signed)
It was nice seeing you today. Unfortunately, your A1C increased as expected to 8.8. Let us resume your Jardiance at 10 mg daily in addition to your Metformin. I'll see you back in 3 months for reassessment.

## 2023-08-02 NOTE — Progress Notes (Signed)
    SUBJECTIVE:   CHIEF COMPLAINT / HPI:   DM2: She is here for follow up. She is compliant with her Metformin XL 500 mg 2 tab daily. She's been out of her Jardiance. Although she received a letter about MAP for [place] which she completed and mailed out. However, she never received any medication in return. Her home CBG has been in the 200 lately.   HM: Recently had her mammogram done. She is scheduled for colonoscopy. She need flu and covid shots.  PERTINENT  PMH / PSH: PMHx reviewed.  OBJECTIVE:   BP 132/64   Pulse 71   Ht 5\' 4"  (1.626 m)   Wt 158 lb (71.7 kg)   SpO2 100%   BMI 27.12 kg/m   Physical Exam Vitals and nursing note reviewed.  Cardiovascular:     Rate and Rhythm: Normal rate and regular rhythm.     Heart sounds: Normal heart sounds. No murmur heard. Pulmonary:     Effort: Pulmonary effort is normal. No respiratory distress.     Breath sounds: Normal breath sounds. No wheezing.  Abdominal:     General: Abdomen is flat. Bowel sounds are normal. There is no distension.     Palpations: Abdomen is soft.  Musculoskeletal:     Right lower leg: No edema.     Left lower leg: No edema.      ASSESSMENT/PLAN:   DM (diabetes mellitus), type 2 (HCC) Her A1C worsened to 8.8 I refilled her Jardiance She is ok to pay $100 per 3 month. I will reach out to our pharmacy tech again about her medication coverage F/U in 3 months for reassessment.    Health maintenance: I reviewed and discussed her mammogram results with her She declined flu and COVID-19 shots.  Janit Pagan, MD [facility]

## 2023-08-02 NOTE — Telephone Encounter (Signed)
Submitted application for FARXIGA to AZ&ME for patient assistance 06/04/23.   Phone: [phone]

## 2023-08-02 NOTE — Assessment & Plan Note (Signed)
Her A1C worsened to 8.8 I refilled her Jardiance She is ok to pay $100 per 3 month. I will reach out to our pharmacy tech again about her medication coverage F/U in 3 months for reassessment.

## 2023-08-06 ENCOUNTER — Encounter: Payer: Self-pay | Admitting: Gastroenterology

## 2023-08-06 ENCOUNTER — Ambulatory Visit: Payer: No Typology Code available for payment source | Admitting: Gastroenterology

## 2023-08-06 VITALS — BP 124/56 | HR 61 | Temp 97.3°F | Resp 14 | Ht 64.0 in | Wt 161.0 lb

## 2023-08-06 DIAGNOSIS — D125 Benign neoplasm of sigmoid colon: Secondary | ICD-10-CM

## 2023-08-06 DIAGNOSIS — K635 Polyp of colon: Secondary | ICD-10-CM

## 2023-08-06 DIAGNOSIS — D123 Benign neoplasm of transverse colon: Secondary | ICD-10-CM

## 2023-08-06 DIAGNOSIS — Z1211 Encounter for screening for malignant neoplasm of colon: Secondary | ICD-10-CM

## 2023-08-06 DIAGNOSIS — D128 Benign neoplasm of rectum: Secondary | ICD-10-CM

## 2023-08-06 DIAGNOSIS — K621 Rectal polyp: Secondary | ICD-10-CM | POA: Diagnosis not present

## 2023-08-06 MED ORDER — SODIUM CHLORIDE 0.9 % IV SOLN: 500.0000 mL | Freq: Once | INTRAVENOUS | Status: DC

## 2023-08-06 NOTE — Telephone Encounter (Signed)
Called az&me to f/u on status.  Provided updated address.  Received notification from AZ&ME regarding patient assistance DENIAL for FARXIGA.   Phone:[phone]  Income criteria 300% [employer]. Pt over program income limit.  Pt shouldve rec'd notification via txt. Company will fax denial letter to office again.  Pt should attempt to apply for medicaid at [facility] (4th floor).

## 2023-08-06 NOTE — Patient Instructions (Signed)
   Handout on polyps given to you today  Await pathology results on polyps removed   Resume previous diet & medications    YOU HAD AN ENDOSCOPIC PROCEDURE TODAY AT THE [facility]:   Refer to the procedure report that was given to you for any specific questions about what was found during the examination.  If the procedure report does not answer your questions, please call your gastroenterologist to clarify.  If you requested that your care partner not be given the details of your procedure findings, then the procedure report has been included in a sealed envelope for you to review at your convenience later.  YOU SHOULD EXPECT: Some feelings of bloating in the abdomen. Passage of more gas than usual.  Walking can help get rid of the air that was put into your GI tract during the procedure and reduce the bloating. If you had a lower endoscopy (such as a colonoscopy or flexible sigmoidoscopy) you may notice spotting of blood in your stool or on the toilet paper. If you underwent a bowel prep for your procedure, you may not have a normal bowel movement for a few days.  Please Note:  You might notice some irritation and congestion in your nose or some drainage.  This is from the oxygen used during your procedure.  There is no need for concern and it should clear up in a day or so.  SYMPTOMS TO REPORT IMMEDIATELY:  Following lower endoscopy (colonoscopy or flexible sigmoidoscopy):  Excessive amounts of blood in the stool  Significant tenderness or worsening of abdominal pains  Swelling of the abdomen that is new, acute  Fever of 100F or higher   For urgent or emergent issues, a gastroenterologist can be reached at any hour by calling [phone]. Do not use MyChart messaging for urgent concerns.    DIET:  We do recommend a small meal at first, but then you may proceed to your regular diet.  Drink plenty of fluids but you should avoid alcoholic beverages for 24  hours.  ACTIVITY:  You should plan to take it easy for the rest of today and you should NOT DRIVE or use heavy machinery until tomorrow (because of the sedation medicines used during the test).    FOLLOW UP: Our staff will call the number listed on your records the next business day following your procedure.  We will call around 7:15- 8:00 am to check on you and address any questions or concerns that you may have regarding the information given to you following your procedure. If we do not reach you, we will leave a message.     If any biopsies were taken you will be contacted by phone or by letter within the next 1-3 weeks.  Please call us at [phone] if you have not heard about the biopsies in 3 weeks.    SIGNATURES/CONFIDENTIALITY: You and/or your care partner have signed paperwork which will be entered into your electronic medical record.  These signatures attest to the fact that that the information above on your After Visit Summary has been reviewed and is understood.  Full responsibility of the confidentiality of this discharge information lies with you and/or your care-partner.

## 2023-08-06 NOTE — Progress Notes (Signed)
Vss nad trans to pacu 

## 2023-08-06 NOTE — Progress Notes (Signed)
Pt's states no medical or surgical changes since previsit or office visit. 

## 2023-08-06 NOTE — Op Note (Signed)
[facility] Patient Name: Danielle Mccann Procedure Date: 08/06/2023 10:20 AM MRN: [id number] Endoscopist: Meryl Dare , MD, [id number] Age: 62 Referring MD:  Date of Birth: 01-16-1961 Gender: Female Account #: [id number] Procedure:                Colonoscopy Indications:              Screening for colorectal malignant neoplasm Medicines:                Monitored Anesthesia Care Procedure:                Pre-Anesthesia Assessment:                           - Prior to the procedure, a History and Physical                            was performed, and patient medications and                            allergies were reviewed. The patient's tolerance of                            previous anesthesia was also reviewed. The risks                            and benefits of the procedure and the sedation                            options and risks were discussed with the patient.                            All questions were answered, and informed consent                            was obtained. Prior Anticoagulants: The patient has                            taken no anticoagulant or antiplatelet agents. ASA                            Grade Assessment: III - A patient with severe                            systemic disease. After reviewing the risks and                            benefits, the patient was deemed in satisfactory                            condition to undergo the procedure.                           After obtaining informed consent, the colonoscope  was passed under direct vision. Throughout the                            procedure, the patient's blood pressure, pulse, and                            oxygen saturations were monitored continuously. The                            CF [id number] #[id number] was introduced through the anus                            and advanced to the the cecum, identified by                             appendiceal orifice and ileocecal valve. The                            ileocecal valve, appendiceal orifice, and rectum                            were photographed. The quality of the bowel                            preparation was good. The colonoscopy was performed                            without difficulty. The patient tolerated the                            procedure well. Scope In: 10:24:08 AM Scope Out: 10:45:09 AM Scope Withdrawal Time: 0 hours 17 minutes 51 seconds  Total Procedure Duration: 0 hours 21 minutes 1 second  Findings:                 The perianal and digital rectal examinations were                            normal.                           Two sessile polyps were found in the mid-sigmoid                            colon and transverse colon. The polyps were 7 to 10                            mm in size. These polyps were removed with a cold                            snare. Resection and retrieval were complete.                           Nine sessile polyps were found in the rectum and  distal sigmoid colon (appear hyperplastic). The                            polyps were 6 to 8 mm in size. These polyps were                            removed with a cold snare. Resection and retrieval                            were complete.                           The exam was otherwise without abnormality on                            direct and retroflexion views. Complications:            No immediate complications. Estimated blood loss:                            None. Estimated Blood Loss:     Estimated blood loss: none. Impression:               - Two 7 to 10 mm polyps in the mid-sigmoid colon                            and in the transverse colon, removed with a cold                            snare. Resected and retrieved.                           - Nine 6 to 8 mm polyps in the rectum and in the                            distal  sigmoid colon, removed with a cold snare.                            Resected and retrieved.                           - The examination was otherwise normal on direct                            and retroflexion views. Recommendation:           - Repeat colonoscopy after studies are complete for                            surveillance based on pathology results.                           - Patient has a contact number available for  emergencies. The signs and symptoms of potential                            delayed complications were discussed with the                            patient. Return to normal activities tomorrow.                            Written discharge instructions were provided to the                            patient.                           - Resume previous diet.                           - Continue present medications.                           - Await pathology results. Meryl Dare, MD 08/06/2023 10:50:13 AM This report has been signed electronically.

## 2023-08-06 NOTE — Progress Notes (Signed)
See 9/27 H&P no changes

## 2023-08-06 NOTE — Progress Notes (Signed)
Called to room to assist during endoscopic procedure.  Patient ID and intended procedure confirmed with present staff. Received instructions for my participation in the procedure from the performing physician.  

## 2023-08-07 ENCOUNTER — Telehealth: Payer: Self-pay | Admitting: *Deleted

## 2023-08-07 NOTE — Telephone Encounter (Signed)
  Follow up Call-     08/06/2023   10:00 AM  Call back number  Post procedure Call Back phone  # [phone]  Permission to leave phone message Yes     Patient questions:   Pt hung up.

## 2023-08-08 LAB — SURGICAL PATHOLOGY

## 2023-08-09 ENCOUNTER — Ambulatory Visit (INDEPENDENT_AMBULATORY_CARE_PROVIDER_SITE_OTHER): Payer: No Typology Code available for payment source | Admitting: Family Medicine

## 2023-08-09 ENCOUNTER — Other Ambulatory Visit: Payer: Self-pay

## 2023-08-09 ENCOUNTER — Other Ambulatory Visit (HOSPITAL_COMMUNITY)
Admission: RE | Admit: 2023-08-09 | Discharge: 2023-08-09 | Disposition: A | Discharge status: Home / Self Care | Payer: No Typology Code available for payment source | Source: Ambulatory Visit | Attending: Family Medicine | Admitting: Family Medicine

## 2023-08-09 VITALS — BP 127/55 | HR 67 | Ht 64.0 in | Wt 157.8 lb

## 2023-08-09 DIAGNOSIS — E119 Type 2 diabetes mellitus without complications: Secondary | ICD-10-CM

## 2023-08-09 DIAGNOSIS — N898 Other specified noninflammatory disorders of vagina: Secondary | ICD-10-CM | POA: Diagnosis present

## 2023-08-09 DIAGNOSIS — Z7984 Long term (current) use of oral hypoglycemic drugs: Secondary | ICD-10-CM | POA: Diagnosis not present

## 2023-08-09 MED ORDER — FLUCONAZOLE 150 MG PO TABS
150.0000 mg | ORAL_TABLET | Freq: Once | ORAL | 0 refills | Status: AC
Start: 2023-08-09 — End: 2023-08-09

## 2023-08-09 MED ORDER — METFORMIN HCL ER 500 MG PO TB24
1000.0000 mg | ORAL_TABLET | Freq: Two times a day (BID) | ORAL | 3 refills | Status: DC
Start: 2023-08-09 — End: 2023-11-07

## 2023-08-09 NOTE — Progress Notes (Signed)
    SUBJECTIVE:   CHIEF COMPLAINT / HPI:   Yeast infection Reports persistent vaginal itching and thick discharge.  Recently restarted Jardiance last week with visit with PCP, symptoms have worsened since that time.  Reports she has been in a cycle of recurrent yeast infections. H/o T2DM most recent A1c 8.8 on 08/02/2023, worse from prior.  PERTINENT  PMH / PSH: T2DM  OBJECTIVE:   BP (!) 127/55   Pulse 67   Ht 5\' 4"  (1.626 m)   Wt 157 lb 12.8 oz (71.6 kg)   SpO2 97%   BMI 27.09 kg/m    General: NAD, pleasant, able to participate in exam Extremities: no edema or cyanosis. Skin: warm and dry, no rashes noted Neuro: alert, no obvious focal deficits Psych: Normal affect and mood Pelvic exam: normal external genitalia, vulva, vagina, cervix, uterus and adnexa, VAGINA: normal appearing vagina with normal color and discharge, no lesions, atrophic, exam chaperoned by Dayshia, CMA.   ASSESSMENT/PLAN:   Assessment & Plan Vaginal itching Recurrent yeast infections in the setting of elevated A1c to 8.8 and Jardiance use. Presenting similarly today with vaginal itching, POC wet prep unavailable therefore will go ahead and treat for yeast infection.  Postmenopausal not sexually active, low concern for STI. -F/u swab results for BV -Diflucan x 1 Type 2 diabetes mellitus without complication, without long-term current use of insulin (HCC) Given vaginal itching recurrence on Jardiance, recommend stopping use. Increase to metformin XR 1000 mg twice daily and advised continued lifestyle changes for DM management. F/u with PCP in 10/2023.     Dr. Elberta Fortis, DO [facility]

## 2023-08-09 NOTE — Assessment & Plan Note (Signed)
Given vaginal itching recurrence on Jardiance, recommend stopping use. Increase to metformin XR 1000 mg twice daily and advised continued lifestyle changes for DM management. F/u with PCP in 10/2023.

## 2023-08-09 NOTE — Patient Instructions (Addendum)
It was wonderful to see you today! Thank you for choosing [facility].   Please bring ALL of your medications with you to every visit.   Today we talked about:  I do think your recurrent vaginal itching and discharge is related to your diabetes and taking the Jardiance which increases the amount of sugar in your urine.  This makes you more susceptible to UTIs and yeast infections.  I do think a secondary option is we could increase her metformin dose and you could stop taking the Jardiance. I will send in the higher dose of Metformin toy our pharmacy. Please continue to work on lifestyle changes such as healthy diet and exercise to further decrease your A1c. I swabbed you today for BV and yeast.  Those results will likely come back tomorrow.  I went ahead and sent in the treatment for yeast please take the 1 pill of Diflucan. If you are positive for BV I will let you know and send in that treatment.  Please follow up in with Dr. Lum Babe in December  If you haven't already, sign up for My Chart to have easy access to your labs results, and communication with your primary care physician.   We are checking some labs today. If they are abnormal, I will call you. If they are normal, I will send you a MyChart message (if it is active) or a letter in the mail. If you do not hear about your labs in the next 2 weeks, please call the office.  Call the clinic at [phone] if your symptoms worsen or you have any concerns.  Please be sure to schedule follow up at the front desk before you leave today.   Elberta Fortis, DO Family Medicine

## 2023-08-12 LAB — CERVICOVAGINAL ANCILLARY ONLY
Bacterial Vaginitis (gardnerella): NEGATIVE
Candida Glabrata: POSITIVE — AB
Candida Vaginitis: POSITIVE — AB
Comment: NEGATIVE
Comment: NEGATIVE
Comment: NEGATIVE

## 2023-08-22 ENCOUNTER — Encounter: Payer: Self-pay | Admitting: Gastroenterology

## 2023-08-22 ENCOUNTER — Encounter: Payer: No Typology Code available for payment source | Admitting: Gastroenterology

## 2023-10-25 ENCOUNTER — Ambulatory Visit: Payer: No Typology Code available for payment source | Admitting: Family Medicine

## 2023-11-07 ENCOUNTER — Ambulatory Visit (INDEPENDENT_AMBULATORY_CARE_PROVIDER_SITE_OTHER): Payer: No Typology Code available for payment source | Admitting: Family Medicine

## 2023-11-07 ENCOUNTER — Encounter: Payer: Self-pay | Admitting: Family Medicine

## 2023-11-07 VITALS — BP 125/59 | HR 76 | Ht 64.0 in | Wt 156.8 lb

## 2023-11-07 DIAGNOSIS — F17200 Nicotine dependence, unspecified, uncomplicated: Secondary | ICD-10-CM | POA: Diagnosis not present

## 2023-11-07 DIAGNOSIS — E119 Type 2 diabetes mellitus without complications: Secondary | ICD-10-CM | POA: Diagnosis not present

## 2023-11-07 LAB — POCT GLYCOSYLATED HEMOGLOBIN (HGB A1C): HbA1c, POC (controlled diabetic range): 6.6 % (ref 0.0–7.0)

## 2023-11-07 MED ORDER — NICOTINE 14 MG/24HR TD PT24: 14.0000 mg | MEDICATED_PATCH | Freq: Every day | TRANSDERMAL | 0 refills | Status: AC

## 2023-11-07 MED ORDER — METFORMIN HCL ER 500 MG PO TB24: 1000.0000 mg | ORAL_TABLET | Freq: Every day | ORAL | Status: DC

## 2023-11-07 NOTE — Progress Notes (Signed)
    SUBJECTIVE:   CHIEF COMPLAINT / HPI:   DM2:  She is here for follow-up. She is compliant with Jardiance  10 mg QD and still takes Metformin  1000 mg QD instead of BID, as instructed by the provider she saw recently. Her home CBGs range between 116 and a max of 120. She had an episode of candida vaginitis with Jardiance  but had been doing fine on this med since then.   Tobacco smoking: The patient has been trying to quit for the last few months, but this is very difficult. She has smoked about 1/2 a pack per day since 1977. She quit briefly in 2020 and resumed smoking again since then. She tried Nicotine  lozenges, but this gave her bad hiccups. She also did not do well previously on the Nicotine  patch but is willing to try this now.   PERTINENT  PMH / PSH: PHx reviewed  OBJECTIVE:   BP (!) 125/59   Pulse 76   Ht 5' 4 (1.626 m)   Wt 156 lb 12.8 oz (71.1 kg)   SpO2 100%   BMI 26.91 kg/m   Physical Exam Vitals and nursing note reviewed.  Cardiovascular:     Rate and Rhythm: Normal rate and regular rhythm.     Heart sounds: Normal heart sounds. No murmur heard. Pulmonary:     Effort: Pulmonary effort is normal. No respiratory distress.     Breath sounds: Normal breath sounds. No wheezing.  Abdominal:     General: Bowel sounds are normal.     Palpations: Abdomen is soft. There is no mass.     Tenderness: There is no abdominal tenderness.  Musculoskeletal:     Right lower leg: No edema.     Left lower leg: No edema.      ASSESSMENT/PLAN:   DM (diabetes mellitus), type 2 (HCC) A1C improved from 8.8 to 6.6 on Jardiance  10 mg every day and Metformin  XL 1000 mg every day Continue same regimen F/U in 3-4 months for DM check Bmet, UACR and FLP checked today I will call soon with her test results  Tobacco dependence Counseling was provided regarding different pharmacologic options, including Chantix  She opted to trial the Nicotine  patch first and consider Chantix  if no  improvement Nicotine  patch 14 mg/24 hrs escribed An appointment has been scheduled with Dr. Koval for smoking cessation as she feels, if she has someone to hold her accountable, this might help her quit I reviewed and discussed CT lung cancer screen result from 2023 and discussed with her which was negative Repeat CT lung cancer screening was discussed and she is interested in this CT Lung ordered   Covid and flu shots offered: She declined both today.  Otto Fairly, MD [facility]

## 2023-11-07 NOTE — Patient Instructions (Signed)
 Varenicline Tablets What is this medication? VARENICLINE (var e NI kleen) helps you quit smoking. It reduces cravings for nicotine, the addictive substance found in tobacco. It is most effective when used in combination with a stop-smoking program. This medicine may be used for other purposes; ask your health care provider or pharmacist if you have questions. COMMON BRAND NAME(S): Chantix What should I tell my care team before I take this medication? They need to know if you have any of these conditions: Heart disease Frequently drink alcohol Kidney disease Mental health condition On hemodialysis Seizures History of stroke Suicidal thoughts, plans, or attempt by you or a family member An unusual or allergic reaction to varenicline, other medications, foods, dyes, or preservatives Pregnant or trying to get pregnant Breast-feeding How should I use this medication? Take this medication by mouth after eating. Take with a full glass of water. Follow the directions on the prescription label. Take your doses at regular intervals. Do not take your medication more often than directed. There are 3 ways you can use this medication to help you quit smoking; talk to your care team to decide which plan is right for you: 1) you can choose a quit date and start this medication 1 week before the quit date, or, 2) you can start taking this medication before you choose a quit date, and then pick a quit date between day 8 and 35 days of treatment, or, 3) if you are not sure that you are able or willing to quit smoking right away, start taking this medication and slowly decrease the amount you smoke as directed by your care team with the goal of being cigarette-free by week 12 of treatment. Stick to your plan; ask about support groups or other ways to help you remain cigarette-free. If you are motivated to quit smoking and did not succeed during a previous attempt with this medication for reasons other than side  effects, or if you returned to smoking after this treatment, speak with your care team about whether another course of this medication may be right for you. A special MedGuide will be given to you by the pharmacist with each prescription and refill. Be sure to read this information carefully each time. Talk to your care team about the use of this medication in children. This medication is not approved for use in children. Overdosage: If you think you have taken too much of this medicine contact a poison control center or emergency room at once. NOTE: This medicine is only for you. Do not share this medicine with others. What if I miss a dose? If you miss a dose, take it as soon as you can. If it is almost time for your next dose, take only that dose. Do not take double or extra doses. What may interact with this medication? Alcohol Insulin Other medications used to help people quit smoking Theophylline Warfarin This list may not describe all possible interactions. Give your health care provider a list of all the medicines, herbs, non-prescription drugs, or dietary supplements you use. Also tell them if you smoke, drink alcohol, or use illegal drugs. Some items may interact with your medicine. What should I watch for while using this medication? It is okay if you do not succeed at your attempt to quit and have a cigarette. You can still continue your quit attempt and keep using this medication as directed. Just throw away your cigarettes and get back to your quit plan. Talk to your care team  before using other treatments to quit smoking. Using this medication with other treatments to quit smoking may increase the risk for side effects compared to using a treatment alone. This medication may affect your coordination, reaction time, or judgment. Do not drive or operate machinery until you know how this medication affects you. Sit up or stand slowly to reduce the risk of dizzy or fainting  spells. Decrease the number of alcoholic beverages that you drink during treatment with this medication until you know if this medication affects your ability to tolerate alcohol. Some people have experienced increased drunkenness (intoxication), unusual or sometimes aggressive behavior, or no memory of things that have happened (amnesia) during treatment with this medication. You may do unusual sleep behaviors or activities you do not remember the day after taking this medication. Activities include driving, making or eating food, talking on the phone, sexual activity, or sleep walking. Stop taking this medication and call your care team right away if you find out you have done activities like this. Patients and their families should watch out for new or worsening depression or thoughts of suicide. Also watch out for sudden changes in feelings such as feeling anxious, agitated, panicky, irritable, hostile, aggressive, impulsive, severely restless, overly excited and hyperactive, or not being able to sleep. If this happens, call your care team. If you have diabetes, and you quit smoking, the effects of insulin may be increased. You may need to reduce your insulin dose. Check with your care team about how you should adjust your insulin dose. What side effects may I notice from receiving this medication? Side effects that you should report to your care team as soon as possible: Allergic reactions or angioedema--skin rash, itching or hives, swelling of the face, eyes, lips, tongue, arms, or legs, trouble swallowing or breathing Heart attack--pain or tightness in the chest, shoulders, arms, or jaw, nausea, shortness of breath, cold or clammy skin, feeling faint or lightheaded Mood and behavior changes--anxiety, nervousness, confusion, hallucinations, irritability, hostility, thoughts of suicide or self-harm, worsening mood, feelings of depression Redness, blistering, peeling, or loosening of the skin,  including inside the mouth Stroke--sudden numbness or weakness of the face, arm, or leg, trouble speaking, confusion, trouble walking, loss of balance or coordination, dizziness, severe headache, change in vision Seizures Side effects that usually do not require medical attention (report to your care team if they continue or are bothersome): Constipation Drowsiness Gas Nausea Trouble sleeping Upset stomach Vivid dreams or nightmares Vomiting This list may not describe all possible side effects. Call your doctor for medical advice about side effects. You may report side effects to FDA at 1-800-FDA-1088. Where should I keep my medication? Keep out of the reach of children and pets. Store at room temperature between 15 and 30 degrees C (59 and 86 degrees F). Throw away any unused medication after the expiration date. NOTE: This sheet is a summary. It may not cover all possible information. If you have questions about this medicine, talk to your doctor, pharmacist, or health care provider.  2024 Elsevier/Gold Standard (2021-09-14 00:00:00)

## 2023-11-07 NOTE — Assessment & Plan Note (Signed)
 A1C improved from 8.8 to 6.6 on Jardiance 10 mg every day and Metformin XL 1000 mg every day Continue same regimen F/U in 3-4 months for DM check Bmet, UACR and FLP checked today I will call soon with her test results

## 2023-11-07 NOTE — Assessment & Plan Note (Addendum)
 Counseling was provided regarding different pharmacologic options, including Chantix  She opted to trial the Nicotine  patch first and consider Chantix  if no improvement Nicotine  patch 14 mg/24 hrs escribed An appointment has been scheduled with Dr. Koval for smoking cessation as she feels, if she has someone to hold her accountable, this might help her quit I reviewed and discussed CT lung cancer screen result from 2023 and discussed with her which was negative Repeat CT lung cancer screening was discussed and she is interested in this CT Lung ordered

## 2023-11-08 ENCOUNTER — Telehealth: Payer: Self-pay | Admitting: Family Medicine

## 2023-11-08 LAB — MICROALBUMIN / CREATININE URINE RATIO
Creatinine, Urine: 40.2 mg/dL
Microalb/Creat Ratio: <7 mg/g{creat} (ref 0–29)
Microalbumin, Urine: <3 ug/mL

## 2023-11-08 LAB — BASIC METABOLIC PANEL
BUN/Creatinine Ratio: 17 (ref 12–28)
BUN: 12 mg/dL (ref 8–27)
CO2: 21 mmol/L (ref 20–29)
Calcium: 9 mg/dL (ref 8.7–10.3)
Chloride: 103 mmol/L (ref 96–106)
Creatinine, Ser: 0.7 mg/dL (ref 0.57–1.00)
Glucose: 94 mg/dL (ref 70–99)
Potassium: 4 mmol/L (ref 3.5–5.2)
Sodium: 140 mmol/L (ref 134–144)
eGFR: 98 mL/min/{1.73_m2} (ref >59)

## 2023-11-08 LAB — LIPID PANEL
Chol/HDL Ratio: 2.8 {ratio} (ref 0.0–4.4)
Cholesterol, Total: 140 mg/dL (ref 100–199)
HDL: 50 mg/dL (ref >39)
LDL Chol Calc (NIH): 55 mg/dL (ref 0–99)
Triglycerides: 219 mg/dL — ABNORMAL HIGH (ref 0–149)
VLDL Cholesterol Cal: 35 mg/dL (ref 5–40)

## 2023-11-08 MED ORDER — ATORVASTATIN CALCIUM 10 MG PO TABS: 10.0000 mg | ORAL_TABLET | Freq: Every day | ORAL | 1 refills | Status: DC

## 2023-11-08 NOTE — Telephone Encounter (Signed)
 I discussed her FLP result with her. Her triglyceride levels are elevated. I suggested increasing Lipitor to 10 mg daily instead of twice weekly, increasing fish oil  to 1000mg  daily to BID, and then transitioning to fish oil  2 tabs BID. We also discussed lifestyle modification. She agreed with the plan. F/U in 6 months for reassessment.

## 2023-11-11 ENCOUNTER — Telehealth: Payer: Self-pay

## 2023-11-11 ENCOUNTER — Other Ambulatory Visit: Payer: Self-pay | Admitting: Family Medicine

## 2023-11-11 MED ORDER — ATORVASTATIN CALCIUM 10 MG PO TABS: 10.0000 mg | ORAL_TABLET | Freq: Every day | ORAL | 1 refills | Status: DC

## 2023-11-11 NOTE — Telephone Encounter (Signed)
 Informed the patient that her instructions has been updated and she should be able to get her medication now.

## 2023-11-11 NOTE — Telephone Encounter (Signed)
 Patient calls nurse line in regards to Atorvastatin prescription.   Prescription has conflicting directions and the pharmacy will not fill.   Will forward to PCP resend with one set of directions.

## 2023-11-12 NOTE — Progress Notes (Signed)
 Patient is scheduled to have CT done Tuesday Jan 14th @800  at [facility]

## 2023-11-15 ENCOUNTER — Ambulatory Visit: Payer: No Typology Code available for payment source | Admitting: Pharmacist

## 2023-11-19 ENCOUNTER — Ambulatory Visit (HOSPITAL_COMMUNITY)
Admission: RE | Admit: 2023-11-19 | Discharge: 2023-11-19 | Disposition: A | Discharge status: Home / Self Care | Payer: No Typology Code available for payment source | Source: Ambulatory Visit | Attending: Family Medicine | Admitting: Family Medicine

## 2023-11-19 DIAGNOSIS — F17200 Nicotine dependence, unspecified, uncomplicated: Secondary | ICD-10-CM | POA: Insufficient documentation

## 2023-11-28 ENCOUNTER — Encounter: Payer: Self-pay | Admitting: Family Medicine

## 2023-12-26 ENCOUNTER — Ambulatory Visit: Payer: No Typology Code available for payment source

## 2024-01-09 ENCOUNTER — Ambulatory Visit: Payer: No Typology Code available for payment source

## 2024-02-11 ENCOUNTER — Other Ambulatory Visit: Payer: Self-pay | Admitting: Family Medicine

## 2024-03-19 LAB — HM DIABETES EYE EXAM

## 2024-04-21 ENCOUNTER — Ambulatory Visit (INDEPENDENT_AMBULATORY_CARE_PROVIDER_SITE_OTHER): Admitting: Family Medicine

## 2024-04-21 ENCOUNTER — Encounter: Payer: Self-pay | Admitting: Family Medicine

## 2024-04-21 VITALS — BP 130/65 | HR 73 | Ht 64.0 in | Wt 155.0 lb

## 2024-04-21 DIAGNOSIS — F17200 Nicotine dependence, unspecified, uncomplicated: Secondary | ICD-10-CM

## 2024-04-21 DIAGNOSIS — F411 Generalized anxiety disorder: Secondary | ICD-10-CM

## 2024-04-21 DIAGNOSIS — E119 Type 2 diabetes mellitus without complications: Secondary | ICD-10-CM | POA: Diagnosis not present

## 2024-04-21 DIAGNOSIS — E781 Pure hyperglyceridemia: Secondary | ICD-10-CM | POA: Diagnosis not present

## 2024-04-21 DIAGNOSIS — Z1211 Encounter for screening for malignant neoplasm of colon: Secondary | ICD-10-CM

## 2024-04-21 DIAGNOSIS — R630 Anorexia: Secondary | ICD-10-CM

## 2024-04-21 LAB — POCT GLYCOSYLATED HEMOGLOBIN (HGB A1C): HbA1c, POC (controlled diabetic range): 6.5 % (ref 0.0–7.0)

## 2024-04-21 NOTE — Assessment & Plan Note (Addendum)
 FLP checked Will call with results soon Continue current dose of Lipitor

## 2024-04-21 NOTE — Patient Instructions (Addendum)
 Nice seeing you today. Your A1C looks good. Good job on working on [employer]. Please cut back on your Metformin  to 500 mg daily. Continue current dose of Jardiance .  Connect with your psychologist for anxiety and you may consider Magnesium Glycenate 200 mg prn to help with stress and anxiety. We can readdress other medications in the future.   Please remember to schedule your colon cancer screening.

## 2024-04-21 NOTE — Assessment & Plan Note (Addendum)
 Continue to work on cutting back on tobacco use

## 2024-04-21 NOTE — Assessment & Plan Note (Addendum)
 Has not lost ton of weight Add on MVI supplement Snack between meals Monitor for now

## 2024-04-21 NOTE — Assessment & Plan Note (Addendum)
 Mostly related to health problems and concerns about her children Discussed various treatment options Does not want to get on SSRI for now May trial Mg Glycinate as needed Reconnect with therapist She agreed with the plan

## 2024-04-21 NOTE — Assessment & Plan Note (Addendum)
 A1C 6.5 She wants to get off some of her meds. Plan to cut back Metformin  to 500 mg every day for now Continue Jardiance  10 mg every day She agreed with the plan

## 2024-04-21 NOTE — Progress Notes (Signed)
 SUBJECTIVE:   CHIEF COMPLAINT / HPI:   DM2/HLD:  Here for follow-up. She is compliant with Metformin  XL 1000 mg QD and Jardiance  10 mg QD. She is compliant with her Lipitor and Fish oil , and she is doing well with her diet. She has cut back on red meat and hot dogs. She eats more [place] and [place]. She is here to follow up for a Triglyceride recheck.   Poor Appetite: She has a poor appetite and is losing weight. Need help with her appetite.  Tobacco use:  She has cut back on tobacco smoking to 3 sticks per day. She d/ced Nicotine  patch.   Stress/Anxiety: This is becoming more of a problem. It gets worse at night when she is about to sleep. She worries about everything, including her health and children. She denies depression. Symptoms have worsened over the last few months. She was connected to a therapist in the past but self-discontinued her visit 3 months ago, since she was feeling better. - night time, last therapy 3 months ago. Was helpful  HM: Need to schedule a colonoscopy and update Shingrix. No FHX of any form of cancer. She is worried about her recent colonoscopy report.   PERTINENT  PMH / PSH: PMHx reviewed  OBJECTIVE:   BP 130/65   Pulse 73   Ht 5' 4 (1.626 m)   Wt 155 lb (70.3 kg)   SpO2 100%   BMI 26.61 kg/m   Physical Exam Vitals and nursing note reviewed.   Cardiovascular:     Rate and Rhythm: Normal rate and regular rhythm.     Heart sounds: Normal heart sounds. No murmur heard. Pulmonary:     Effort: Pulmonary effort is normal. No respiratory distress.     Breath sounds: Normal breath sounds. No wheezing.  Abdominal:     Palpations: Abdomen is soft. There is no mass.     Tenderness: There is no abdominal tenderness. There is no guarding.   Musculoskeletal:     Right lower leg: No edema.     Left lower leg: No edema.     Comments: Sensory exam of the foot is normal, tested with the monofilament. Good pulses, no lesions or ulcers, good peripheral  pulses.    Psychiatric:        Mood and Affect: Mood is anxious.        Thought Content: Thought content does not include homicidal or suicidal ideation. Thought content does not include homicidal or suicidal plan.     Comments: Teary during this visit      ASSESSMENT/PLAN:   Assessment & Plan Type 2 diabetes mellitus without complication, without long-term current use of insulin  (HCC) A1C 6.5 She wants to get off some of her meds. Plan to cut back Metformin  to 500 mg every day for now Continue Jardiance  10 mg every day She agreed with the plan Hypertriglyceridemia FLP checked Will call with results soon Continue current dose of Lipitor GAD (generalized anxiety disorder) Mostly related to health problems and concerns about her children Discussed various treatment options Does not want to get on SSRI for now May trial Mg Glycinate as needed Reconnect with therapist She agreed with the plan Tobacco dependence Continue to work on cutting back on tobacco use Poor appetite Has not lost ton of weight Add on MVI supplement Snack between meals Monitor for now Colon cancer screening Due for colonoscopy in Oct I reviewed and discussed her last colonoscopy result with her and she became  nervous about it Patient reassured, result was benign, but requires close monitoring She will schedule colonoscopy with GI and I also placed GI referral   Shingrix discussed She said she does not ever want to get this shot Will take off HM    Penni Bowman, MD [facility] : [facility]

## 2024-04-22 ENCOUNTER — Ambulatory Visit: Payer: Self-pay | Admitting: Family Medicine

## 2024-04-22 ENCOUNTER — Other Ambulatory Visit: Payer: Self-pay | Admitting: Family Medicine

## 2024-04-22 LAB — LIPID PANEL
Chol/HDL Ratio: 2.1 ratio (ref 0.0–4.4)
Cholesterol, Total: 94 mg/dL — ABNORMAL LOW (ref 100–199)
HDL: 44 mg/dL (ref >39)
LDL Chol Calc (NIH): 32 mg/dL (ref 0–99)
Triglycerides: 89 mg/dL (ref 0–149)
VLDL Cholesterol Cal: 18 mg/dL (ref 5–40)

## 2024-06-03 ENCOUNTER — Encounter: Payer: Self-pay | Admitting: Internal Medicine

## 2024-06-30 ENCOUNTER — Other Ambulatory Visit: Payer: Self-pay | Admitting: Family Medicine

## 2024-06-30 ENCOUNTER — Telehealth: Payer: Self-pay | Admitting: *Deleted

## 2024-06-30 MED ORDER — ATORVASTATIN CALCIUM 10 MG PO TABS: ORAL_TABLET | ORAL | 2 refills | Status: DC

## 2024-06-30 NOTE — Telephone Encounter (Signed)
 Yes, she thought you told her to start taking daily, which she has been.  I told her I did not see that in the message so I would have to check with you.  Yes she would need a new script because she has picked up all of the refills on the other because she was taking daily. Harlene Carte, CMA

## 2024-06-30 NOTE — Telephone Encounter (Signed)
 Pt reports that she thought she was supposed to take atorvastatin  once a day (recent change) but a new script was not sent in.  Please advise.  Harlene Carte, CMA

## 2024-07-09 MED ORDER — ATORVASTATIN CALCIUM 10 MG PO TABS: ORAL_TABLET | ORAL | 0 refills | Status: AC

## 2024-07-09 MED ORDER — ATORVASTATIN CALCIUM 10 MG PO TABS: 10.0000 mg | ORAL_TABLET | Freq: Every day | ORAL | 0 refills | Status: DC

## 2024-07-09 NOTE — Telephone Encounter (Signed)
 Patient calls nurse line in regards to Atorvastatin .   She reports she was under the impression she was supposed to take Atorvastatin  every single day. She reports she has been doing this for a while now. She reports since her last apt.   She reports she is now out of the medication and unable to refill until the end of September due to directions.   She is anxious her lipids will be elevated again.   Advised will forward to PCP for advisement.

## 2024-07-09 NOTE — Telephone Encounter (Signed)
 I called the pharmacy, and they confirmed that her insurance will not cover the medication even if I reorder it. I have to send in a prescription that says take once daily and then transition back to her original schedule. I advised the patient that her prescription will show daily. However, it is twice weekly or every 3 days. She agreed with the plan.

## 2024-07-21 ENCOUNTER — Other Ambulatory Visit: Payer: Self-pay | Admitting: Family Medicine

## 2024-07-21 DIAGNOSIS — Z1231 Encounter for screening mammogram for malignant neoplasm of breast: Secondary | ICD-10-CM

## 2024-07-28 ENCOUNTER — Other Ambulatory Visit (HOSPITAL_COMMUNITY)
Admission: RE | Admit: 2024-07-28 | Discharge: 2024-07-28 | Disposition: A | Source: Ambulatory Visit | Attending: Family Medicine | Admitting: Family Medicine

## 2024-07-28 ENCOUNTER — Encounter: Payer: Self-pay | Admitting: Family Medicine

## 2024-07-28 ENCOUNTER — Ambulatory Visit (INDEPENDENT_AMBULATORY_CARE_PROVIDER_SITE_OTHER): Admitting: Family Medicine

## 2024-07-28 VITALS — BP 120/71 | HR 77 | Ht 64.0 in | Wt 155.2 lb

## 2024-07-28 DIAGNOSIS — F411 Generalized anxiety disorder: Secondary | ICD-10-CM

## 2024-07-28 DIAGNOSIS — Z124 Encounter for screening for malignant neoplasm of cervix: Secondary | ICD-10-CM | POA: Insufficient documentation

## 2024-07-28 DIAGNOSIS — N760 Acute vaginitis: Secondary | ICD-10-CM | POA: Diagnosis not present

## 2024-07-28 DIAGNOSIS — E118 Type 2 diabetes mellitus with unspecified complications: Secondary | ICD-10-CM

## 2024-07-28 LAB — POCT GLYCOSYLATED HEMOGLOBIN (HGB A1C): HbA1c, POC (controlled diabetic range): 6.6 % (ref 0.0–7.0)

## 2024-07-28 LAB — POCT WET PREP (WET MOUNT)
Clue Cells Wet Prep Whiff POC: NEGATIVE
Trichomonas Wet Prep HPF POC: ABSENT

## 2024-07-28 MED ORDER — FLUCONAZOLE 100 MG PO TABS: ORAL_TABLET | ORAL | 0 refills | Status: AC
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
## 2024-07-28 NOTE — Patient Instructions (Signed)

## 2024-07-28 NOTE — Assessment & Plan Note (Signed)
 Chronic anxiety exacerbated by familial stressors. Previous counseling beneficial but unaffordable. Hesitant to start pharmacotherapy. - Provide information on coping strategies for anxiety. - Suggest contacting insurance for behavioral health providers that accept her plan. - Offer to place a referral to a psychologist as well

## 2024-07-29 ENCOUNTER — Ambulatory Visit: Payer: Self-pay | Admitting: Family Medicine

## 2024-07-29 LAB — CYTOLOGY - PAP
Comment: NEGATIVE
Diagnosis: NEGATIVE
High risk HPV: NEGATIVE

## 2024-08-05 ENCOUNTER — Ambulatory Visit

## 2024-08-05 VITALS — Ht 64.0 in | Wt 155.0 lb

## 2024-08-05 DIAGNOSIS — Z860100 Personal history of colon polyps, unspecified: Secondary | ICD-10-CM

## 2024-08-05 MED ORDER — NA SULFATE-K SULFATE-MG SULF 17.5-3.13-1.6 GM/177ML PO SOLN: 1.0000 | Freq: Once | ORAL | 0 refills | Status: AC

## 2024-08-05 NOTE — Progress Notes (Signed)

## 2024-08-12 ENCOUNTER — Ambulatory Visit
Admission: RE | Admit: 2024-08-12 | Discharge: 2024-08-12 | Disposition: A | Source: Ambulatory Visit | Attending: Family Medicine | Admitting: Family Medicine

## 2024-08-12 DIAGNOSIS — Z1231 Encounter for screening mammogram for malignant neoplasm of breast: Secondary | ICD-10-CM

## 2024-08-14 ENCOUNTER — Ambulatory Visit: Payer: Self-pay | Admitting: Family Medicine

## 2024-08-18 ENCOUNTER — Other Ambulatory Visit: Payer: Self-pay | Admitting: Family Medicine

## 2024-08-24 ENCOUNTER — Telehealth (INDEPENDENT_AMBULATORY_CARE_PROVIDER_SITE_OTHER): Payer: Self-pay | Admitting: Internal Medicine

## 2024-08-24 DIAGNOSIS — Z860100 Personal history of colon polyps, unspecified: Secondary | ICD-10-CM

## 2024-08-24 MED ORDER — NA SULFATE-K SULFATE-MG SULF 17.5-3.13-1.6 GM/177ML PO SOLN: 1.0000 | Freq: Once | ORAL | 0 refills | Status: AC

## 2024-08-24 NOTE — Telephone Encounter (Signed)
 Suprep sent to pharmacy of choice. Notified patient it was sent to her pharmacy. All questions answered. Pt verbalizes understanding.

## 2024-08-24 NOTE — Telephone Encounter (Signed)
 Inbound call from patient stating she needs prep medication sent to pharmacy for upcoming procedure on 08/28/24 Requesting a call once medication is sent to pharmacy  Please advise  Thank you

## 2024-08-27 ENCOUNTER — Encounter: Payer: Self-pay | Admitting: Internal Medicine

## 2024-08-27 NOTE — Progress Notes (Unsigned)
 [facility] History and Physical   Primary Care Physician:  Anders Otto DASEN, MD   Reason for Procedure:    Encounter Diagnosis  Name Primary?   History of colonic polyps Yes     Plan:    Colonoscopy     HPI: Danielle Mccann is a 63 y.o. female with a history of removal of 10 sessile serrated polyps maximum size 10 mm approximately 1 year ago by Dr. Aneita.  The remainder the colonoscopy was normal.   Past Medical History:  Diagnosis Date   Breast lump 12/18/2019   Depression    Diabetes mellitus without complication (HCC)    Hyperlipidemia    Paresthesia 09/24/2017   Skin lesion 11/16/2011   Skin tag 06/19/2017   Smoking 1/2 pack a day or less    Smoking 1/2 pack a day or less     Past Surgical History:  Procedure Laterality Date   COLONOSCOPY     2014, 2024 (10 SSP's)     Current Outpatient Medications  Medication Sig Dispense Refill   atorvastatin  (LIPITOR) 10 MG tablet TAKE 1 TABLET(10 MG) BY MOUTH EVERY THREE DAYS 30 tablet 0   fluconazole  (DIFLUCAN ) 100 MG tablet Take one tablet today and the second tablet 3 days later. 2 tablet 0   glucose blood (FREESTYLE INSULINX TEST) test strip Use to check glucose once daily 100 each 12   glucose monitoring kit (FREESTYLE) monitoring kit Use to check glucose once daily 1 each 0   JARDIANCE  10 MG TABS tablet TAKE 1 TABLET(10 MG) BY MOUTH DAILY 90 tablet 1   Lancets (FREESTYLE) lancets Use to check glucose once daily 100 each 12   metFORMIN  (GLUCOPHAGE -XR) 500 MG 24 hr tablet Take 2 tablets (1,000 mg total) by mouth daily with breakfast.     nicotine  (NICODERM CQ  - DOSED IN MG/24 HOURS) 14 mg/24hr patch Place 1 patch (14 mg total) onto the skin daily. 28 patch 0   No current facility-administered medications for this visit.    Allergies as of 08/28/2024   (No Known Allergies)    Family History  Problem Relation Age of Onset   Stroke Mother    Diabetes Mother    Hypertension Mother     Alcohol abuse Father    Diabetes Father    Hypertension Father    Heart disease Neg Hx    Cancer Neg Hx    Colon cancer Neg Hx    Colon polyps Neg Hx    Esophageal cancer Neg Hx    Rectal cancer Neg Hx    Stomach cancer Neg Hx    Breast cancer Neg Hx     Social History   Socioeconomic History   Marital status: Single    Spouse name: Not on file   Number of children: Not on file   Years of education: Not on file   Highest education level: Not on file  Occupational History   Not on file  Tobacco Use   Smoking status: Every Day    Current packs/day: 0.50    Average packs/day: 0.5 packs/day for 48.8 years (24.4 ttl pk-yrs)    Types: Cigarettes    Start date: 11/06/1975   Smokeless tobacco: Never  Vaping Use   Vaping status: Never Used  Substance and Sexual Activity   Alcohol use: No    Alcohol/week: 0.0 standard drinks of alcohol   Drug use: No   Sexual activity: Not Currently    Comment: quit 11 years ago  Other Topics Concern   Not on file  Social History Narrative   Lives with teenage children Ages 87 & 24.  Originally from [place] - brother ans sister here.  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     Social Drivers of Corporate investment banker Strain: Not on file  Food Insecurity: Not on file  Transportation Needs: Not on file  Physical Activity: Not on file  Stress: Not on file  Social Connections: Not on file  Intimate Partner Violence: Not on file    Review of Systems: Positive for *** All other review of systems negative except as mentioned in the HPI.  Physical Exam: Vital signs There were no vitals taken for this visit.  General:   Alert,  Well-developed, well-nourished, pleasant and cooperative in NAD Lungs:  Clear throughout to auscultation.   Heart:  Regular rate and rhythm; no murmurs, clicks, rubs,  or gallops. Abdomen:  Soft, nontender and nondistended. Normal bowel sounds.   Neuro/Psych:  Alert and  cooperative. Normal mood and affect. A and O x 3   @Ladeana Laplant  CHARLENA Commander, MD, [facility] [phone] (pager) 08/27/2024 9:00 PM@

## 2024-08-28 ENCOUNTER — Encounter: Payer: Self-pay | Admitting: Internal Medicine

## 2024-08-28 ENCOUNTER — Ambulatory Visit: Admitting: Internal Medicine

## 2024-08-28 VITALS — BP 131/56 | HR 67 | Temp 97.9°F | Resp 20 | Ht 64.0 in | Wt 155.0 lb

## 2024-08-28 DIAGNOSIS — Z1211 Encounter for screening for malignant neoplasm of colon: Secondary | ICD-10-CM

## 2024-08-28 DIAGNOSIS — D124 Benign neoplasm of descending colon: Secondary | ICD-10-CM

## 2024-08-28 DIAGNOSIS — D125 Benign neoplasm of sigmoid colon: Secondary | ICD-10-CM

## 2024-08-28 DIAGNOSIS — K635 Polyp of colon: Secondary | ICD-10-CM

## 2024-08-28 DIAGNOSIS — Z860101 Personal history of adenomatous and serrated colon polyps: Secondary | ICD-10-CM | POA: Diagnosis not present

## 2024-08-28 DIAGNOSIS — D122 Benign neoplasm of ascending colon: Secondary | ICD-10-CM | POA: Diagnosis not present

## 2024-08-28 DIAGNOSIS — Z860100 Personal history of colon polyps, unspecified: Secondary | ICD-10-CM

## 2024-08-28 MED ORDER — SODIUM CHLORIDE 0.9 % IV SOLN: 500.0000 mL | INTRAVENOUS | Status: DC

## 2024-08-28 NOTE — Op Note (Signed)
 [facility] Patient Name: Danielle Mccann Procedure Date: 08/28/2024 7:18 AM MRN: [id number] Endoscopist: Lupita FORBES Commander , MD, [id number] Age: 63 Referring MD:  Date of Birth: 26-Mar-1961 Gender: Female Account #: [id number] Procedure:                Colonoscopy Indications:              High risk colon cancer surveillance: Personal                            history of colonic polyps, Last colonoscopy: 2024                            10 polyps removed (ssp max 10mm) Medicines:                Monitored Anesthesia Care Procedure:                Pre-Anesthesia Assessment:                           - Prior to the procedure, a History and Physical                            was performed, and patient medications and                            allergies were reviewed. The patient's tolerance of                            previous anesthesia was also reviewed. The risks                            and benefits of the procedure and the sedation                            options and risks were discussed with the patient.                            All questions were answered, and informed consent                            was obtained. Prior Anticoagulants: The patient has                            taken no anticoagulant or antiplatelet agents. ASA                            Grade Assessment: II - A patient with mild systemic                            disease. After reviewing the risks and benefits,                            the patient was deemed in satisfactory condition to  undergo the procedure.                           After obtaining informed consent, the colonoscope                            was passed under direct vision. Throughout the                            procedure, the patient's blood pressure, pulse, and                            oxygen saturations were monitored continuously. The                            CF [id number] #[id number]  was introduced through the anus                            and advanced to the the cecum, identified by                            appendiceal orifice and ileocecal valve. The                            colonoscopy was performed without difficulty. The                            patient tolerated the procedure well. The quality                            of the bowel preparation was good. The ileocecal                            valve, appendiceal orifice, and rectum were                            photographed. Scope In: 8:12:19 AM Scope Out: 8:34:03 AM Scope Withdrawal Time: 0 hours 17 minutes 40 seconds  Total Procedure Duration: 0 hours 21 minutes 44 seconds  Findings:                 The perianal and digital rectal examinations were                            normal.                           Seven sessile polyps were found in the sigmoid                            colon, descending colon and ascending colon. The                            polyps were 3 to 7 mm in size. These polyps were  removed with a cold snare. Resection and retrieval                            were complete. Verification of patient                            identification for the specimen was done. Estimated                            blood loss was minimal.                           The exam was otherwise without abnormality on                            direct and retroflexion views. Complications:            No immediate complications. Estimated Blood Loss:     Estimated blood loss was minimal. Impression:               - Seven 3 to 7 mm polyps in the sigmoid colon, in                            the descending colon and in the ascending colon,                            removed with a cold snare. Resected and retrieved.                           - The examination was otherwise normal on direct                            and retroflexion views.                           - Personal  history of colonic polyps. 10 sessile                            serrated polyps max 10 mm removed 2024. Recommendation:           - Patient has a contact number available for                            emergencies. The signs and symptoms of potential                            delayed complications were discussed with the                            patient. Return to normal activities tomorrow.                            Written discharge instructions were provided to the  patient.                           - Resume previous diet.                           - Continue present medications.                           - Repeat colonoscopy is recommended for                            surveillance. The colonoscopy date will be                            determined after pathology results from today's                            exam become available for review. Lupita FORBES Commander, MD 08/28/2024 8:46:51 AM This report has been signed electronically.

## 2024-08-28 NOTE — Progress Notes (Signed)
 Called to room to assist during endoscopic procedure.  Patient ID and intended procedure confirmed with present staff. Received instructions for my participation in the procedure from the performing physician.

## 2024-08-28 NOTE — Patient Instructions (Addendum)
 I found and removed 7 polyps today.  They are small and look benign.  All else normal.  I will let you know pathology results and when to have another routine colonoscopy by mail and/or My Chart.  I appreciate the opportunity to care for you. Danielle CHARLENA Commander, MD, FACG  YOU HAD AN ENDOSCOPIC PROCEDURE TODAY AT THE [facility]:   Refer to the procedure report that was given to you for any specific questions about what was found during the examination.  If the procedure report does not answer your questions, please call your gastroenterologist to clarify.  If you requested that your care partner not be given the details of your procedure findings, then the procedure report has been included in a sealed envelope for you to review at your convenience later.  YOU SHOULD EXPECT: Some feelings of bloating in the abdomen. Passage of more gas than usual.  Walking can help get rid of the air that was put into your GI tract during the procedure and reduce the bloating. If you had a lower endoscopy (such as a colonoscopy or flexible sigmoidoscopy) you may notice spotting of blood in your stool or on the toilet paper. If you underwent a bowel prep for your procedure, you may not have a normal bowel movement for a few days.  Please Note:  You might notice some irritation and congestion in your nose or some drainage.  This is from the oxygen used during your procedure.  There is no need for concern and it should clear up in a day or so.  SYMPTOMS TO REPORT IMMEDIATELY:  Following lower endoscopy (colonoscopy or flexible sigmoidoscopy):  Excessive amounts of blood in the stool  Significant tenderness or worsening of abdominal pains  Swelling of the abdomen that is new, acute  Fever of 100F or higher  For urgent or emergent issues, a gastroenterologist can be reached at any hour by calling [phone]. Do not use MyChart messaging for urgent concerns.    DIET:  We do recommend a small meal at  first, but then you may proceed to your regular diet.  Drink plenty of fluids but you should avoid alcoholic beverages for 24 hours.  ACTIVITY:  You should plan to take it easy for the rest of today and you should NOT DRIVE or use heavy machinery until tomorrow (because of the sedation medicines used during the test).    FOLLOW UP: Our staff will call the number listed on your records the next business day following your procedure.  We will call around 7:15- 8:00 am to check on you and address any questions or concerns that you may have regarding the information given to you following your procedure. If we do not reach you, we will leave a message.     If any biopsies were taken you will be contacted by phone or by letter within the next 1-3 weeks.  Please call us  at [phone] if you have not heard about the biopsies in 3 weeks.    SIGNATURES/CONFIDENTIALITY: You and/or your care partner have signed paperwork which will be entered into your electronic medical record.  These signatures attest to the fact that that the information above on your After Visit Summary has been reviewed and is understood.  Full responsibility of the confidentiality of this discharge information lies with you and/or your care-partner.

## 2024-08-28 NOTE — Progress Notes (Signed)
 Pt's states no medical or surgical changes since previsit or office visit.

## 2024-08-28 NOTE — Progress Notes (Signed)
 To PACU via stretcher, sedated, good respiratory effort, VSS.

## 2024-08-31 ENCOUNTER — Telehealth: Payer: Self-pay

## 2024-08-31 NOTE — Telephone Encounter (Signed)
  Follow up Call-     08/28/2024    7:22 AM 08/06/2023   10:00 AM  Call back number  Post procedure Call Back phone  # [phone]  Permission to leave phone message Yes Yes     Patient questions:  Do you have a fever, pain , or abdominal swelling? No. Pain Score  0 *  Have you tolerated food without any problems? Yes.    Have you been able to return to your normal activities? Yes.    Do you have any questions about your discharge instructions: Diet   No. Medications  No. Follow up visit  No.  Do you have questions or concerns about your Care? No.  Actions: * If pain score is 4 or above: No action needed, pain <4.

## 2024-09-02 LAB — SURGICAL PATHOLOGY

## 2024-09-03 ENCOUNTER — Ambulatory Visit: Payer: Self-pay | Admitting: Internal Medicine

## 2024-09-03 DIAGNOSIS — Z860100 Personal history of colon polyps, unspecified: Secondary | ICD-10-CM | POA: Insufficient documentation

## 2024-09-22 ENCOUNTER — Ambulatory Visit (HOSPITAL_COMMUNITY): Payer: Self-pay | Admitting: Licensed Clinical Social Worker

## 2024-11-06 ENCOUNTER — Other Ambulatory Visit: Payer: Self-pay | Admitting: Family Medicine

## 2024-11-06 DIAGNOSIS — E119 Type 2 diabetes mellitus without complications: Secondary | ICD-10-CM

## 2024-11-27 ENCOUNTER — Ambulatory Visit: Admitting: Family Medicine

## 2024-11-30 ENCOUNTER — Encounter: Payer: Self-pay | Admitting: Family Medicine

## 2024-12-01 ENCOUNTER — Ambulatory Visit: Admitting: Family Medicine
